# Patient Record
Sex: Male | Born: 1965
Health system: Southern US, Community
[De-identification: ages and names within clinical notes are randomized; demographics above are authoritative.]

## PROBLEM LIST (undated history)

## (undated) DIAGNOSIS — I10 Essential (primary) hypertension: Secondary | ICD-10-CM

## (undated) DIAGNOSIS — K589 Irritable bowel syndrome without diarrhea: Secondary | ICD-10-CM

## (undated) DIAGNOSIS — K219 Gastro-esophageal reflux disease without esophagitis: Secondary | ICD-10-CM

## (undated) DIAGNOSIS — IMO0002 Reserved for concepts with insufficient information to code with codable children: Secondary | ICD-10-CM

## (undated) DIAGNOSIS — I491 Atrial premature depolarization: Secondary | ICD-10-CM

## (undated) DIAGNOSIS — E669 Obesity, unspecified: Secondary | ICD-10-CM

## (undated) DIAGNOSIS — J302 Other seasonal allergic rhinitis: Secondary | ICD-10-CM

## (undated) DIAGNOSIS — E785 Hyperlipidemia, unspecified: Secondary | ICD-10-CM

## (undated) HISTORY — DX: Obesity, unspecified: E66.9

## (undated) HISTORY — DX: Reserved for concepts with insufficient information to code with codable children: IMO0002

## (undated) HISTORY — DX: Gastro-esophageal reflux disease without esophagitis: K21.9

## (undated) HISTORY — DX: Atrial premature depolarization: I49.1

## (undated) HISTORY — DX: Other seasonal allergic rhinitis: J30.2

## (undated) HISTORY — DX: Essential (primary) hypertension: I10

## (undated) HISTORY — DX: Irritable bowel syndrome, unspecified: K58.9

## (undated) HISTORY — DX: Hyperlipidemia, unspecified: E78.5

## (undated) HISTORY — PX: OTHER SURGICAL HISTORY: SHX169

---

## 2008-03-10 ENCOUNTER — Ambulatory Visit: Payer: Self-pay | Admitting: Family Medicine

## 2008-03-10 DIAGNOSIS — Z9189 Other specified personal risk factors, not elsewhere classified: Secondary | ICD-10-CM

## 2008-03-10 DIAGNOSIS — I10 Essential (primary) hypertension: Secondary | ICD-10-CM | POA: Insufficient documentation

## 2008-04-21 ENCOUNTER — Ambulatory Visit: Payer: Self-pay | Admitting: Family Medicine

## 2008-04-26 LAB — CONVERTED CEMR LAB
ALT: 37 units/L (ref 0–53)
AST: 21 units/L (ref 0–37)
Albumin: 3.9 g/dL (ref 3.5–5.2)
Alkaline Phosphatase: 57 units/L (ref 39–117)
BUN: 14 mg/dL (ref 6–23)
Basophils Relative: 0.6 % (ref 0.0–1.0)
Chloride: 104 meq/L (ref 96–112)
Creatinine, Ser: 1 mg/dL (ref 0.4–1.5)
Eosinophils Relative: 1.6 % (ref 0.0–5.0)
Glucose, Bld: 113 mg/dL — ABNORMAL HIGH (ref 70–99)
HCT: 44.6 % (ref 39.0–52.0)
HDL: 33.3 mg/dL — ABNORMAL LOW (ref >39.0)
Monocytes Relative: 9.4 % (ref 3.0–12.0)
Neutrophils Relative %: 62.5 % (ref 43.0–77.0)
Platelets: 308 10*3/uL (ref 150–400)
Potassium: 4 meq/L (ref 3.5–5.1)
RBC: 5.07 M/uL (ref 4.22–5.81)
Total CHOL/HDL Ratio: 4.3
Total Protein: 7.6 g/dL (ref 6.0–8.3)
VLDL: 14 mg/dL (ref 0–40)
WBC: 7 10*3/uL (ref 4.5–10.5)

## 2008-04-28 ENCOUNTER — Ambulatory Visit: Payer: Self-pay | Admitting: Family Medicine

## 2008-04-28 DIAGNOSIS — H6120 Impacted cerumen, unspecified ear: Secondary | ICD-10-CM

## 2008-10-21 ENCOUNTER — Ambulatory Visit: Payer: Self-pay | Admitting: Family Medicine

## 2008-10-21 DIAGNOSIS — H547 Unspecified visual loss: Secondary | ICD-10-CM | POA: Insufficient documentation

## 2008-10-21 DIAGNOSIS — R7309 Other abnormal glucose: Secondary | ICD-10-CM | POA: Insufficient documentation

## 2008-10-26 LAB — CONVERTED CEMR LAB
CO2: 31 meq/L (ref 19–32)
Calcium: 9.3 mg/dL (ref 8.4–10.5)
Chloride: 104 meq/L (ref 96–112)
Hgb A1c MFr Bld: 6 % (ref 4.6–6.0)
Potassium: 4.1 meq/L (ref 3.5–5.1)
Sodium: 141 meq/L (ref 135–145)
Total CHOL/HDL Ratio: 4.3

## 2009-07-18 ENCOUNTER — Ambulatory Visit: Payer: Self-pay | Admitting: Family Medicine

## 2009-07-18 DIAGNOSIS — H60399 Other infective otitis externa, unspecified ear: Secondary | ICD-10-CM

## 2010-01-11 ENCOUNTER — Telehealth: Payer: Self-pay | Admitting: Family Medicine

## 2010-01-31 DIAGNOSIS — E785 Hyperlipidemia, unspecified: Secondary | ICD-10-CM | POA: Insufficient documentation

## 2011-01-08 NOTE — Progress Notes (Signed)
Summary: REQ FOR MED  Phone Note Call from Patient   Caller: Patient [phone] Reason for Call: Talk to Nurse, Talk to Doctor Summary of Call: Pt called to adv that his son has been DX by his pediatrician with the Flu..... Both this pt and his wife Enos Muhl, [place]: 08.26.1971) were adv to obtain a RX for med (Tamiflu 75 mg Capsule) from their PCP..... Pt adv that they were given a sample which they took last night but need a RX to continue with.... Pt adv that same can be sent to [facility] at [place] / [employer]  location.  Initial call taken by: Debbra Riding,  January 11, 2010 11:09 AM  Follow-up for Phone Call        call in Tamiflu 75 mg two times a day for 7 days Follow-up by: Nelwyn Salisbury MD,  January 11, 2010 12:08 PM  Additional Follow-up for Phone Call Additional follow up Details #1::        Phone Call Completed, Rx Called In Additional Follow-up by: Alfred Levins, CMA,  January 11, 2010 12:19 PM    New/Updated Medications: TAMIFLU 75 MG CAPS (OSELTAMIVIR PHOSPHATE) Take one capsule by mouth twice a day Prescriptions: TAMIFLU 75 MG CAPS (OSELTAMIVIR PHOSPHATE) Take one capsule by mouth twice a day  #14 x 0   Entered by:   Alfred Levins, CMA   Authorized by:   Nelwyn Salisbury MD   Signed by:   Alfred Levins, CMA on 01/11/2010   Method used:   Electronically to        [facility] # [id number]* (retail)       [address]       [place], [place]  [address]       Ph: [phone]       Fax: [phone]   RxID:   [id number]

## 2011-01-18 ENCOUNTER — Encounter: Payer: Self-pay | Admitting: Family Medicine

## 2011-01-18 ENCOUNTER — Ambulatory Visit (INDEPENDENT_AMBULATORY_CARE_PROVIDER_SITE_OTHER): Payer: BC Managed Care – PPO | Admitting: Family Medicine

## 2011-01-18 VITALS — BP 120/86 | HR 79 | Temp 98.2°F | Wt 227.0 lb

## 2011-01-18 DIAGNOSIS — H6120 Impacted cerumen, unspecified ear: Secondary | ICD-10-CM

## 2011-01-18 DIAGNOSIS — H9190 Unspecified hearing loss, unspecified ear: Secondary | ICD-10-CM

## 2011-01-18 NOTE — Progress Notes (Signed)
  Subjective:    Patient ID: Michael Parker, male    DOB: 11/20/1966, 45 y.o.   MRN: [id number]  HPI Here for one week of pressure in both ears and decreased hearing. No pain. No URI symptoms.    Review of Systems  Constitutional: Negative.   HENT: Positive for hearing loss and congestion. Negative for ear pain, nosebleeds, facial swelling, rhinorrhea, sneezing, neck pain, neck stiffness, dental problem, postnasal drip, sinus pressure, tinnitus and ear discharge.   Eyes: Negative.   Respiratory: Negative.        Objective:   Physical Exam  Constitutional: He appears well-developed and well-nourished. No distress.  HENT:  Head: Normocephalic and atraumatic.  Nose: Nose normal.  Mouth/Throat: Oropharynx is clear and moist. No oropharyngeal exudate.       Both ears are occluded  with cerumen   Eyes: Conjunctivae and EOM are normal. Pupils are equal, round, and reactive to light.  Neck: Normal range of motion. Neck supple.  Lymphadenopathy:    He has no cervical adenopathy.          Assessment & Plan:  Both ears were irrigated clear with water. After his ears were irrigated, he felt fine and his hearing was back to normal.

## 2011-03-29 ENCOUNTER — Other Ambulatory Visit (INDEPENDENT_AMBULATORY_CARE_PROVIDER_SITE_OTHER): Payer: BC Managed Care – PPO | Admitting: Family Medicine

## 2011-03-29 DIAGNOSIS — Z0000 Encounter for general adult medical examination without abnormal findings: Secondary | ICD-10-CM

## 2011-03-29 LAB — POCT URINALYSIS DIPSTICK
Blood, UA: NEGATIVE
Nitrite, UA: NEGATIVE
Protein, UA: NEGATIVE
Spec Grav, UA: 1.025
Urobilinogen, UA: 0.2

## 2011-03-29 LAB — LIPID PANEL
LDL Cholesterol: 86 mg/dL (ref 0–99)
Total CHOL/HDL Ratio: 3
Triglycerides: 41 mg/dL (ref 0.0–149.0)

## 2011-03-29 LAB — CBC WITH DIFFERENTIAL/PLATELET
Basophils Relative: 0.6 % (ref 0.0–3.0)
Eosinophils Absolute: 0.1 10*3/uL (ref 0.0–0.7)
Hemoglobin: 15.3 g/dL (ref 13.0–17.0)
Lymphocytes Relative: 22.8 % (ref 12.0–46.0)
MCHC: 34 g/dL (ref 30.0–36.0)
Neutro Abs: 5.1 10*3/uL (ref 1.4–7.7)
RBC: 5.03 Mil/uL (ref 4.22–5.81)

## 2011-03-29 LAB — BASIC METABOLIC PANEL
CO2: 28 mEq/L (ref 19–32)
Calcium: 9 mg/dL (ref 8.4–10.5)
Chloride: 100 mEq/L (ref 96–112)
Potassium: 4.5 mEq/L (ref 3.5–5.1)
Sodium: 136 mEq/L (ref 135–145)

## 2011-03-29 LAB — HEPATIC FUNCTION PANEL
AST: 20 U/L (ref 0–37)
Albumin: 3.9 g/dL (ref 3.5–5.2)
Alkaline Phosphatase: 71 U/L (ref 39–117)
Total Protein: 7.2 g/dL (ref 6.0–8.3)

## 2011-03-29 LAB — TSH: TSH: 2.02 u[IU]/mL (ref 0.35–5.50)

## 2011-04-01 ENCOUNTER — Telehealth: Payer: Self-pay | Admitting: Family Medicine

## 2011-04-01 NOTE — Telephone Encounter (Signed)
Message copied by Burnard Leigh on Mon Apr 01, 2011 10:50 AM ------      Message from: Dwaine Deter      Created: Mon Apr 01, 2011 10:08 AM       normal

## 2011-04-05 ENCOUNTER — Ambulatory Visit (INDEPENDENT_AMBULATORY_CARE_PROVIDER_SITE_OTHER): Payer: BC Managed Care – PPO | Admitting: Family Medicine

## 2011-04-05 ENCOUNTER — Encounter: Payer: Self-pay | Admitting: Family Medicine

## 2011-04-05 VITALS — BP 148/96 | HR 88 | Temp 98.3°F | Ht 72.5 in | Wt 223.0 lb

## 2011-04-05 DIAGNOSIS — Z0000 Encounter for general adult medical examination without abnormal findings: Secondary | ICD-10-CM

## 2011-04-05 MED ORDER — AMLODIPINE BESYLATE 5 MG PO TABS: 5.0000 mg | ORAL_TABLET | Freq: Every day | ORAL | Status: DC

## 2011-04-05 NOTE — Progress Notes (Signed)
  Subjective:    Patient ID: Michael Parker, male    DOB: 1966/11/06, 45 y.o.   MRN: [id number]  HPI 45 yr old male for a cpx. He feels fine with no complaints. He had taken BP meds a few years ago but was able to stop them with diet and exercise. Now he has gained about 16 lbs in the past year, and the BP is back up again.    Review of Systems  Constitutional: Negative.   HENT: Negative.   Eyes: Negative.   Respiratory: Negative.   Cardiovascular: Negative.   Gastrointestinal: Negative.   Genitourinary: Negative.   Musculoskeletal: Negative.   Skin: Negative.   Neurological: Negative.   Hematological: Negative.   Psychiatric/Behavioral: Negative.        Objective:   Physical Exam  Constitutional: He is oriented to person, place, and time. He appears well-developed and well-nourished. No distress.  HENT:  Head: Normocephalic and atraumatic.  Right Ear: External ear normal.  Left Ear: External ear normal.  Nose: Nose normal.  Mouth/Throat: Oropharynx is clear and moist. No oropharyngeal exudate.  Eyes: Conjunctivae and EOM are normal. Pupils are equal, round, and reactive to light. Right eye exhibits no discharge. Left eye exhibits no discharge. No scleral icterus.  Neck: Neck supple. No JVD present. No tracheal deviation present. No thyromegaly present.  Cardiovascular: Normal rate, regular rhythm, normal heart sounds and intact distal pulses.  Exam reveals no gallop and no friction rub.   No murmur heard. Pulmonary/Chest: Effort normal and breath sounds normal. No respiratory distress. He has no wheezes. He has no rales. He exhibits no tenderness.  Abdominal: Soft. Bowel sounds are normal. He exhibits no distension and no mass. There is no tenderness. There is no rebound and no guarding.  Genitourinary: Penis normal. No penile tenderness.  Musculoskeletal: Normal range of motion. He exhibits no edema and no tenderness.  Lymphadenopathy:    He has no cervical adenopathy.    Neurological: He is alert and oriented to person, place, and time. He has normal reflexes. No cranial nerve deficit. He exhibits normal muscle tone. Coordination normal.  Skin: Skin is warm and dry. No rash noted. He is not diaphoretic. No erythema. No pallor.  Psychiatric: He has a normal mood and affect. His behavior is normal. Judgment and thought content normal.          Assessment & Plan:  He needs to lose weight. Get back on HTN meds.

## 2012-01-30 ENCOUNTER — Encounter: Payer: Self-pay | Admitting: Family Medicine

## 2012-01-30 ENCOUNTER — Ambulatory Visit (INDEPENDENT_AMBULATORY_CARE_PROVIDER_SITE_OTHER): Payer: BC Managed Care – PPO | Admitting: Family Medicine

## 2012-01-30 VITALS — BP 126/88 | HR 80 | Temp 98.3°F | Wt 228.0 lb

## 2012-01-30 DIAGNOSIS — H6120 Impacted cerumen, unspecified ear: Secondary | ICD-10-CM

## 2012-01-30 NOTE — Progress Notes (Signed)
  Subjective:    Patient ID: Michael Parker, male    DOB: 1966/08/23, 46 y.o.   MRN: [id number]  HPI Here fopr 3 days of pressure and decreased hearing in the right ear. No pain. He tries to flush his ears out with peroxide at home periodically. No sinus or URI symptoms.    Review of Systems  Constitutional: Negative.   HENT: Positive for hearing loss. Negative for ear pain, congestion, postnasal drip and ear discharge.   Eyes: Negative.   Respiratory: Negative.        Objective:   Physical Exam  Constitutional: He appears well-developed and well-nourished.  HENT:  Nose: Nose normal.  Mouth/Throat: Oropharynx is clear and moist. No oropharyngeal exudate.       Right ear canal is full of cerumen  Eyes: Conjunctivae are normal.  Lymphadenopathy:    He has no cervical adenopathy.          Assessment & Plan:  The ear canal was irrigated clear with water

## 2012-03-24 ENCOUNTER — Telehealth: Payer: Self-pay | Admitting: Family Medicine

## 2012-03-24 NOTE — Telephone Encounter (Signed)
Pt needs to know date of last tetanus shot. Pt filling out a form for his work.

## 2012-03-25 NOTE — Telephone Encounter (Signed)
Spoke with pt, looks like he has not had one since coming to [place].

## 2012-03-30 ENCOUNTER — Other Ambulatory Visit (INDEPENDENT_AMBULATORY_CARE_PROVIDER_SITE_OTHER): Payer: BC Managed Care – PPO

## 2012-03-30 DIAGNOSIS — Z0000 Encounter for general adult medical examination without abnormal findings: Secondary | ICD-10-CM

## 2012-03-30 LAB — POCT URINALYSIS DIPSTICK
Bilirubin, UA: NEGATIVE
Blood, UA: NEGATIVE
Glucose, UA: NEGATIVE
Nitrite, UA: NEGATIVE
Spec Grav, UA: 1.02

## 2012-03-30 LAB — CBC WITH DIFFERENTIAL/PLATELET
Basophils Absolute: 0 10*3/uL (ref 0.0–0.1)
Lymphocytes Relative: 14.7 % (ref 12.0–46.0)
Monocytes Relative: 6.4 % (ref 3.0–12.0)
Platelets: 279 10*3/uL (ref 150.0–400.0)
RDW: 13.5 % (ref 11.5–14.6)

## 2012-03-30 LAB — LIPID PANEL
HDL: 51.3 mg/dL (ref >39.00)
Total CHOL/HDL Ratio: 3
Triglycerides: 44 mg/dL (ref 0.0–149.0)

## 2012-03-30 LAB — BASIC METABOLIC PANEL
BUN: 19 mg/dL (ref 6–23)
Calcium: 9 mg/dL (ref 8.4–10.5)
GFR: 95.28 mL/min (ref >60.00)
Glucose, Bld: 90 mg/dL (ref 70–99)
Sodium: 139 mEq/L (ref 135–145)

## 2012-03-30 LAB — HEPATIC FUNCTION PANEL
AST: 19 U/L (ref 0–37)
Alkaline Phosphatase: 71 U/L (ref 39–117)
Bilirubin, Direct: 0.1 mg/dL (ref 0.0–0.3)
Total Bilirubin: 0.7 mg/dL (ref 0.3–1.2)

## 2012-03-31 LAB — TSH: TSH: 1.53 u[IU]/mL (ref 0.35–5.50)

## 2012-04-06 ENCOUNTER — Encounter: Payer: BC Managed Care – PPO | Admitting: Family Medicine

## 2012-04-06 NOTE — Progress Notes (Signed)
Quick Note:  Left voice message ______ 

## 2012-04-08 ENCOUNTER — Encounter: Payer: Self-pay | Admitting: Family Medicine

## 2012-04-08 ENCOUNTER — Ambulatory Visit (INDEPENDENT_AMBULATORY_CARE_PROVIDER_SITE_OTHER): Payer: BC Managed Care – PPO | Admitting: Family Medicine

## 2012-04-08 VITALS — BP 128/76 | HR 73 | Temp 98.4°F | Ht 72.5 in | Wt 222.0 lb

## 2012-04-08 DIAGNOSIS — Z0000 Encounter for general adult medical examination without abnormal findings: Secondary | ICD-10-CM

## 2012-04-08 MED ORDER — AMLODIPINE BESYLATE 5 MG PO TABS: 5.0000 mg | ORAL_TABLET | Freq: Every day | ORAL | Status: DC

## 2012-04-08 NOTE — Progress Notes (Signed)
  Subjective:    Patient ID: Michael Parker, male    DOB: 11-May-1966, 46 y.o.   MRN: [id number]  HPI 46 yr old male for a cpx. He feels well and has no concerns.   Review of Systems  Constitutional: Negative.   HENT: Negative.   Eyes: Negative.   Respiratory: Negative.   Cardiovascular: Negative.   Gastrointestinal: Negative.   Genitourinary: Negative.   Musculoskeletal: Negative.   Skin: Negative.   Neurological: Negative.   Hematological: Negative.   Psychiatric/Behavioral: Negative.        Objective:   Physical Exam  Constitutional: He is oriented to person, place, and time. He appears well-developed and well-nourished. No distress.  HENT:  Head: Normocephalic and atraumatic.  Right Ear: External ear normal.  Left Ear: External ear normal.  Nose: Nose normal.  Mouth/Throat: Oropharynx is clear and moist. No oropharyngeal exudate.  Eyes: Conjunctivae and EOM are normal. Pupils are equal, round, and reactive to light. Right eye exhibits no discharge. Left eye exhibits no discharge. No scleral icterus.  Neck: Neck supple. No JVD present. No tracheal deviation present. No thyromegaly present.  Cardiovascular: Normal rate, regular rhythm, normal heart sounds and intact distal pulses.  Exam reveals no gallop and no friction rub.   No murmur heard. Pulmonary/Chest: Effort normal and breath sounds normal. No respiratory distress. He has no wheezes. He has no rales. He exhibits no tenderness.  Abdominal: Soft. Bowel sounds are normal. He exhibits no distension and no mass. There is no tenderness. There is no rebound and no guarding.  Genitourinary: Rectum normal, prostate normal and penis normal. Guaiac negative stool. No penile tenderness.  Musculoskeletal: Normal range of motion. He exhibits no edema and no tenderness.  Lymphadenopathy:    He has no cervical adenopathy.  Neurological: He is alert and oriented to person, place, and time. He has normal reflexes. No cranial nerve  deficit. He exhibits normal muscle tone. Coordination normal.  Skin: Skin is warm and dry. No rash noted. He is not diaphoretic. No erythema. No pallor.  Psychiatric: He has a normal mood and affect. His behavior is normal. Judgment and thought content normal.          Assessment & Plan:  Well exam.

## 2012-12-09 HISTORY — PX: UPPER GASTROINTESTINAL ENDOSCOPY: SHX188

## 2012-12-24 ENCOUNTER — Emergency Department (HOSPITAL_COMMUNITY): Payer: BC Managed Care – PPO

## 2012-12-24 ENCOUNTER — Emergency Department (HOSPITAL_COMMUNITY)
Admission: EM | Admit: 2012-12-24 | Discharge: 2012-12-24 | Disposition: A | Discharge status: Home / Self Care | Payer: BC Managed Care – PPO | Attending: Emergency Medicine | Admitting: Emergency Medicine

## 2012-12-24 ENCOUNTER — Encounter (HOSPITAL_COMMUNITY): Payer: Self-pay | Admitting: *Deleted

## 2012-12-24 DIAGNOSIS — Z8639 Personal history of other endocrine, nutritional and metabolic disease: Secondary | ICD-10-CM | POA: Insufficient documentation

## 2012-12-24 DIAGNOSIS — E119 Type 2 diabetes mellitus without complications: Secondary | ICD-10-CM | POA: Insufficient documentation

## 2012-12-24 DIAGNOSIS — I10 Essential (primary) hypertension: Secondary | ICD-10-CM | POA: Insufficient documentation

## 2012-12-24 DIAGNOSIS — I2510 Atherosclerotic heart disease of native coronary artery without angina pectoris: Secondary | ICD-10-CM | POA: Insufficient documentation

## 2012-12-24 DIAGNOSIS — R197 Diarrhea, unspecified: Secondary | ICD-10-CM

## 2012-12-24 DIAGNOSIS — E669 Obesity, unspecified: Secondary | ICD-10-CM | POA: Insufficient documentation

## 2012-12-24 DIAGNOSIS — I880 Nonspecific mesenteric lymphadenitis: Secondary | ICD-10-CM

## 2012-12-24 DIAGNOSIS — M793 Panniculitis, unspecified: Secondary | ICD-10-CM

## 2012-12-24 DIAGNOSIS — Z8619 Personal history of other infectious and parasitic diseases: Secondary | ICD-10-CM | POA: Insufficient documentation

## 2012-12-24 DIAGNOSIS — Z862 Personal history of diseases of the blood and blood-forming organs and certain disorders involving the immune mechanism: Secondary | ICD-10-CM | POA: Insufficient documentation

## 2012-12-24 DIAGNOSIS — R109 Unspecified abdominal pain: Secondary | ICD-10-CM | POA: Insufficient documentation

## 2012-12-24 LAB — URINALYSIS, ROUTINE W REFLEX MICROSCOPIC
Bilirubin Urine: NEGATIVE
Glucose, UA: NEGATIVE mg/dL
Hgb urine dipstick: NEGATIVE
Ketones, ur: NEGATIVE mg/dL
Protein, ur: NEGATIVE mg/dL

## 2012-12-24 LAB — COMPREHENSIVE METABOLIC PANEL
AST: 28 U/L (ref 0–37)
Albumin: 3.7 g/dL (ref 3.5–5.2)
Alkaline Phosphatase: 65 U/L (ref 39–117)
BUN: 22 mg/dL (ref 6–23)
Chloride: 105 mEq/L (ref 96–112)
Potassium: 4.3 mEq/L (ref 3.5–5.1)
Total Bilirubin: 0.3 mg/dL (ref 0.3–1.2)

## 2012-12-24 LAB — CBC WITH DIFFERENTIAL/PLATELET
Basophils Absolute: 0 10*3/uL (ref 0.0–0.1)
Basophils Relative: 0 % (ref 0–1)
Hemoglobin: 14.6 g/dL (ref 13.0–17.0)
MCHC: 34.2 g/dL (ref 30.0–36.0)
Monocytes Relative: 6 % (ref 3–12)
Neutro Abs: 9.3 10*3/uL — ABNORMAL HIGH (ref 1.7–7.7)
Neutrophils Relative %: 85 % — ABNORMAL HIGH (ref 43–77)
RBC: 4.94 MIL/uL (ref 4.22–5.81)
WBC: 11 10*3/uL — ABNORMAL HIGH (ref 4.0–10.5)

## 2012-12-24 MED ORDER — DICYCLOMINE HCL 20 MG PO TABS: 20.0000 mg | ORAL_TABLET | Freq: Two times a day (BID) | ORAL | Status: DC

## 2012-12-24 MED ORDER — CIPROFLOXACIN HCL 500 MG PO TABS: ORAL_TABLET | ORAL | Status: DC

## 2012-12-24 MED ORDER — METRONIDAZOLE 500 MG PO TABS: 500.0000 mg | ORAL_TABLET | Freq: Two times a day (BID) | ORAL | Status: DC

## 2012-12-24 MED ORDER — CIPROFLOXACIN IN D5W 400 MG/200ML IV SOLN: 400.0000 mg | Freq: Once | INTRAVENOUS | Status: DC

## 2012-12-24 MED ORDER — SODIUM CHLORIDE 0.9 % IV BOLUS (SEPSIS): 1000.0000 mL | Freq: Once | INTRAVENOUS | Status: DC

## 2012-12-24 MED ORDER — METRONIDAZOLE IN NACL 5-0.79 MG/ML-% IV SOLN: 500.0000 mg | Freq: Once | INTRAVENOUS | Status: DC

## 2012-12-24 MED ORDER — SODIUM CHLORIDE 0.9 % IV BOLUS (SEPSIS)
1000.0000 mL | Freq: Once | INTRAVENOUS | Status: AC
  Administered 2012-12-24: 1000 mL via INTRAVENOUS

## 2012-12-24 MED ORDER — IOHEXOL 300 MG/ML  SOLN
20.0000 mL | INTRAMUSCULAR | Status: AC
  Administered 2012-12-24: 07:00:00 via ORAL

## 2012-12-24 MED ORDER — IOHEXOL 300 MG/ML  SOLN
80.0000 mL | Freq: Once | INTRAMUSCULAR | Status: AC | PRN
  Administered 2012-12-24: 80 mL via INTRAVENOUS

## 2012-12-24 NOTE — ED Notes (Signed)
Patient ambulated to restroom and tolerated well.  

## 2012-12-24 NOTE — ED Provider Notes (Signed)
Assumed care of the patient from PA paz.  Michael Parker is a 47 y.o. male w a hx of DM & CAD who presented with c/o diarrhea. He reported intermittent diarrhea over the last week with resolution. He ate a large meal (ham sandwitch, corn dogs and chocolate milk)And then  had an episode of "explosive" diarrhea and severe abdominal cramping/pain. The pain has been gradually improving. Pain is periumbilical and may represent tenesmus. Patient awainting labs  And CT abdomen to r/o IBD.  Physical Exam  Nursing note and vitals reviewed. Constitutional: He appears well-developed and well-nourished. No distress.  HENT:  Head: Normocephalic and atraumatic.  Eyes: Conjunctivae normal are normal. No scleral icterus.  Neck: Normal range of motion. Neck supple.  Cardiovascular: Normal rate, regular rhythm and normal heart sounds.   Pulmonary/Chest: Effort normal and breath sounds normal. No respiratory distress.  Abdominal: Soft. There is no tenderness.  Musculoskeletal: He exhibits no edema.  Neurological: He is alert.  Skin: Skin is warm and dry. He is not diaphoretic.  Psychiatric: His behavior is normal.     9:06 AM CT Abdomen Pelvis W Contrast (Final result)   Result time:12/24/12 0820    Final result by Rad Results In Interface (12/24/12 08:20:18)    Narrative:   *RADIOLOGY REPORT*  Clinical Data: Epigastric periumbilical abdominal pain. Nausea. Loose stools. History of coronary artery disease, obesity, diabetes.  CT ABDOMEN AND PELVIS WITH CONTRAST  Technique: Multidetector CT imaging of the abdomen and pelvis was performed following the standard protocol during bolus administration of intravenous contrast.  Contrast: 1 OMNIPAQUE IOHEXOL 300 MG/ML SOLN, 80mL OMNIPAQUE IOHEXOL 300 MG/ML SOLN  Comparison: None  Findings: Lung bases: Clear lung bases. Normal heart size without pericardial or pleural effusion. The lower thoracic esophagus is dilated and contrast  filled.  Abdomen/pelvis: Normal liver, spleen, distal stomach, pancreas, gallbladder, biliary tract, adrenal glands. Right renal sub centimeter cyst. An upper pole left renal cyst or minimally complex cyst. Bilateral too small to characterize renal lesions.  No retroperitoneal or retrocrural adenopathy.  Normal colon and terminal ileum. Normal appendix, including on image 48/series 2. Normal small bowel without abdominal ascites.  There are prominent nodes within the jejunal mesentery with increased mesenteric fat density. Example image 34/series 2.  No pelvic adenopathy. Normal urinary bladder and prostate. No significant free fluid.  Bones/Musculoskeletal: Degenerative partial fusion of the left sacroiliac joint. Advanced degenerative disc disease at the lumbosacral junction.  IMPRESSION:  1. Findings suspicious for mesenteric adenitis/panniculitis. 2. Normal appendix, without other explanation for abdominal pain. 3. Esophageal air fluid level suggests dysmotility or gastroesophageal reflux.   Original Report Authenticated By: Jeronimo Greaves, M.D.        Patient with adenitis/panniculutis on CT.  Concern for yersinia infection.  Will begin d/c with abx cipro/flagyl and bentyl.  Patient pain resolved.  Paitnet seen in shared visit with Dr. Ranae Palms.  At this time there does not appear to be any evidence of an acute emergency medical condition and the patient appears stable for discharge with appropriate outpatient follow up.Diagnosis was discussed with patient who verbalizes understanding and is agreeable to discharge. Pt case discussed with Dr. Ranae Palms who agrees with my plan.    Arthor Captain, PA-C 12/25/12 [phone]

## 2012-12-24 NOTE — ED Provider Notes (Signed)
Medical screening examination/treatment/procedure(s) were performed by non-physician practitioner and as supervising physician I was immediately available for consultation/collaboration.  Jasmine Awe, MD 12/24/12 [phone]

## 2012-12-24 NOTE — ED Notes (Signed)
Pt states that he started having diarrhea on Sunday, that has progressively worsened and now pt has severe cramping.

## 2012-12-24 NOTE — ED Provider Notes (Signed)
History     CSN: [id number]  Arrival date & time 12/24/12  0343   First MD Initiated Contact with Patient 12/24/12 [phone]      Chief Complaint  Patient presents with  . Abdominal Pain    (Consider location/radiation/quality/duration/timing/severity/associated sxs/prior treatment) HPI Comments: Michael Parker is a 47 y.o. male w a hx of DM & CAD presents to the ER c/o diarrhea. He reports that he has been suffering from intermittent diarrhea over the last week and thought it had resolved at which point he ate a very large meal (ham sandwitch, corn dogs and chocolate milk). After pt had an episode of "explosive" diarrhea and severe abdominal cramping/pain. The pain has been gradually improving. Pain was a 12/q0 and now rated at 2/10. Symptoms are moderate. No known exacerbating or alleviating factors. Pain located in the peri umbilical region and is without radiation. Associated symptoms include nausea. Pt denies any melena, hematochezia, vomiting, fevers, night sweats, chills, chest pain, SOB, syncope, dizziness, or cold/hot intolerance. Pt is a non smoker and does not use chronic NSAIDs.   Patient is a 47 y.o. male presenting with abdominal pain.  Abdominal Pain The primary symptoms of the illness include abdominal pain.    Past Medical History  Diagnosis Date  . Hypertension   . Chickenpox   . CAD (coronary artery disease)     mild calciium score of 49  . Obesity   . Diabetes mellitus   . Hyperlipemia     No past surgical history on file.  Family History  Problem Relation Age of Onset  . Depression      family hx  . Hyperlipidemia      family hx  . Hypertension      family hx  . Sudden death      family hx     History  Substance Use Topics  . Smoking status: Never Smoker   . Smokeless tobacco: Never Used  . Alcohol Use: No      Review of Systems  Gastrointestinal: Positive for abdominal pain.  All other systems reviewed and are negative.    Allergies    Codeine  Home Medications   Current Outpatient Rx  Name  Route  Sig  Dispense  Refill  . AMLODIPINE BESYLATE 5 MG PO TABS   Oral   Take 1 tablet (5 mg total) by mouth daily.   90 tablet   3     BP 157/90  Pulse 92  Temp 97.5 F (36.4 C)  Resp 18  SpO2 99%  Physical Exam  Nursing note and vitals reviewed. Constitutional: Vital signs are normal. He appears well-developed and well-nourished. No distress.  HENT:  Head: Normocephalic and atraumatic.  Mouth/Throat: Uvula is midline, oropharynx is clear and moist and mucous membranes are normal.  Eyes: Conjunctivae normal and EOM are normal. Pupils are equal, round, and reactive to light.  Neck: Normal range of motion and full passive range of motion without pain. Neck supple. No spinous process tenderness and no muscular tenderness present. No rigidity. No Brudzinski's sign noted.  Cardiovascular: Regular rhythm.        Mild tachycardia  Pulmonary/Chest: Effort normal and breath sounds normal. No accessory muscle usage. Not tachypneic. No respiratory distress.       LCAB   Abdominal: Soft. Normal appearance. He exhibits no distension, no ascites, no pulsatile midline mass and no mass. There is tenderness. There is no CVA tenderness. No hernia.       Hyperactive  bowel sounds. Obese abdomen without peritoneal signs. No pulsatile aorta. Mild periumbilical ttp.   Lymphadenopathy:    He has no cervical adenopathy.  Neurological: He is alert.  Skin: Skin is warm and dry. No rash noted. He is not diaphoretic.       No rash, skin warm and non diaphoretic  Psychiatric: He has a normal mood and affect. His speech is normal and behavior is normal.    ED Course  Procedures (including critical care time)   Labs Reviewed  CBC WITH DIFFERENTIAL  COMPREHENSIVE METABOLIC PANEL  URINALYSIS, ROUTINE W REFLEX MICROSCOPIC   No results found.   No diagnosis found.    MDM  Pt w hx of DM  to ER c/o intermittent diarrhea and sever  abdominal cramping x 1 week. Pt has been seen and examined; Initial history and physical IV fluids, pain meds & antiemetics given. Labs and urine tests have been ordered and pending. Disposition will be pending lab studies and reassessment. Will be signed out to the oncoming provider, [employer], [place] 12/24/12 [phone]

## 2013-01-02 NOTE — ED Provider Notes (Signed)
Medical screening examination/treatment/procedure(s) were performed by non-physician practitioner and as supervising physician I was immediately available for consultation/collaboration.   Loren Racer, MD 01/02/13 1530

## 2013-01-06 ENCOUNTER — Encounter: Payer: Self-pay | Admitting: Family Medicine

## 2013-01-06 ENCOUNTER — Ambulatory Visit (INDEPENDENT_AMBULATORY_CARE_PROVIDER_SITE_OTHER): Payer: BC Managed Care – PPO | Admitting: Family Medicine

## 2013-01-06 VITALS — BP 150/90 | HR 98 | Temp 98.6°F | Wt 215.0 lb

## 2013-01-06 DIAGNOSIS — K589 Irritable bowel syndrome without diarrhea: Secondary | ICD-10-CM

## 2013-01-06 DIAGNOSIS — I880 Nonspecific mesenteric lymphadenitis: Secondary | ICD-10-CM

## 2013-01-06 MED ORDER — NYSTATIN 100000 UNIT/ML MT SUSP: 500000.0000 [IU] | Freq: Four times a day (QID) | OROMUCOSAL | Status: DC

## 2013-01-06 NOTE — Progress Notes (Signed)
  Subjective:    Patient ID: Michael Parker, male    DOB: 10/08/1966, 47 y.o.   MRN: [id number]  HPI Here to follow up an ER visit on 12-24-12 for abdominal pain and diarrhea. He had had some diarrhea for a week and then on the evening of the ER visit he ate a large meal, then promptly had explosive diarrhea and severe cramps. He has never had any nausea or vomiting, no fever. His labs were normal except for a WBC of 11.0 with a slight left shift. The CT scan was normal except for some enlarged mesenteric nodes. He was felt to have mesenteric adenitis and was given courses of Flagyl and Cipro. He now feels much better with onoy mild cramps and occasional light diarrhea. He has a long hx of IBS since he was a teenager, and he often has diarrhea. He thinks he is back to his baseline now. He mentions having a white color on the tongue but it is not sore.    Review of Systems  Constitutional: Negative.   HENT: Negative.   Gastrointestinal: Positive for abdominal pain and diarrhea. Negative for nausea, vomiting, constipation, blood in stool and abdominal distention.       Objective:   Physical Exam  Constitutional: He appears well-developed and well-nourished.  HENT:       The tongue is white but not tender  Abdominal: Soft. Bowel sounds are normal. He exhibits no distension and no mass. There is no tenderness. There is no rebound and no guarding.          Assessment & Plan:  The mesenteric adenitis has resolved. Treat the thrush with Nystatin. He has changed his diet to avoid spicy foods to help with the IBS.

## 2013-01-15 ENCOUNTER — Encounter: Payer: Self-pay | Admitting: Family Medicine

## 2013-01-15 ENCOUNTER — Ambulatory Visit (INDEPENDENT_AMBULATORY_CARE_PROVIDER_SITE_OTHER): Payer: BC Managed Care – PPO | Admitting: Family Medicine

## 2013-01-15 VITALS — BP 146/82 | HR 96 | Temp 98.3°F | Wt 214.0 lb

## 2013-01-15 DIAGNOSIS — R109 Unspecified abdominal pain: Secondary | ICD-10-CM

## 2013-01-15 DIAGNOSIS — Z23 Encounter for immunization: Secondary | ICD-10-CM

## 2013-01-15 NOTE — Progress Notes (Signed)
  Subjective:    Patient ID: Michael Parker, male    DOB: [birth year], 47 y.o.   MRN: [id number]  HPI Here for a TDaP and also with questions about his abdomen. He was here one week ago to follow up after a bout of mesenteric adenitis, and we advised him to eat a very bland diet and to advance this very slowly. He did very well on this with abdominal pains at all. Then 2 days ago he decided he could, barbequed chicken, pizza, and chocolate chip cookies. After eating all this he had a bout of right flank pains that lasted about 6 hours. This resolved and today he feels fine again.    Review of Systems  Constitutional: Negative.   Respiratory: Negative.   Cardiovascular: Negative.   Gastrointestinal: Positive for abdominal pain. Negative for nausea, vomiting, diarrhea, constipation, blood in stool and abdominal distention.       Objective:   Physical Exam  Constitutional: He appears well-developed and well-nourished.  Abdominal: Soft. Bowel sounds are normal. He exhibits no distension and no mass. There is no tenderness. There is no rebound and no guarding.          Assessment & Plan:  I think he simply ate too much too fast, and he ate fatty spicy foods before he should have. Advised him to go back to a bland diet as we discussed.

## 2013-01-29 ENCOUNTER — Encounter: Payer: Self-pay | Admitting: Family Medicine

## 2013-01-29 ENCOUNTER — Ambulatory Visit (INDEPENDENT_AMBULATORY_CARE_PROVIDER_SITE_OTHER): Payer: BC Managed Care – PPO | Admitting: Family Medicine

## 2013-01-29 VITALS — BP 150/90 | HR 97 | Temp 98.7°F | Wt 200.0 lb

## 2013-01-29 DIAGNOSIS — R1013 Epigastric pain: Secondary | ICD-10-CM

## 2013-01-29 LAB — CBC WITH DIFFERENTIAL/PLATELET
Basophils Absolute: 0 10*3/uL (ref 0.0–0.1)
Basophils Relative: 0.6 % (ref 0.0–3.0)
Hemoglobin: 16.1 g/dL (ref 13.0–17.0)
Lymphocytes Relative: 15.6 % (ref 12.0–46.0)
Monocytes Relative: 7.8 % (ref 3.0–12.0)
Neutro Abs: 5 10*3/uL (ref 1.4–7.7)
Neutrophils Relative %: 75.7 % (ref 43.0–77.0)
RBC: 5.48 Mil/uL (ref 4.22–5.81)
RDW: 14.6 % (ref 11.5–14.6)

## 2013-01-29 LAB — BASIC METABOLIC PANEL
CO2: 27 mEq/L (ref 19–32)
Calcium: 9.4 mg/dL (ref 8.4–10.5)
Creatinine, Ser: 1.1 mg/dL (ref 0.4–1.5)
Glucose, Bld: 102 mg/dL — ABNORMAL HIGH (ref 70–99)

## 2013-01-29 LAB — LIPASE: Lipase: 32 U/L (ref 11.0–59.0)

## 2013-01-29 LAB — HEPATIC FUNCTION PANEL
Albumin: 4.4 g/dL (ref 3.5–5.2)
Total Protein: 8 g/dL (ref 6.0–8.3)

## 2013-01-29 MED ORDER — ESOMEPRAZOLE MAGNESIUM 40 MG PO CPDR: 40.0000 mg | DELAYED_RELEASE_CAPSULE | Freq: Every day | ORAL | Status: DC

## 2013-01-29 NOTE — Progress Notes (Signed)
  Subjective:    Patient ID: Michael Parker, male    DOB: [birth year], 47 y.o.   MRN: [id number]  HPI Here with his wife to follow up on abdominal pains. The pains come and go, and they are centered around the epigastrium and the RUQ. He alternated between having formed stools and diarrhea. No nausea or fever. He is eating bland foods, but he is reluctant to eat anything because it makes him feel bad. He has lost 14 lbs since this began one month ago.    Review of Systems  Constitutional: Positive for appetite change and unexpected weight change. Negative for fever and activity change.  Gastrointestinal: Positive for abdominal pain, diarrhea and abdominal distention. Negative for vomiting, constipation, blood in stool and rectal pain.       Objective:   Physical Exam  Constitutional: He appears well-developed and well-nourished. No distress.  Abdominal: Soft. Bowel sounds are normal. He exhibits no distension and no mass. There is no rebound and no guarding.  Mildly tender in the epigastrium and RUQ          Assessment & Plan:  This could represent a duodenal ulcer, so I gave him samples to try Nexium. Get labs today. Refer to GI

## 2013-02-01 ENCOUNTER — Encounter: Payer: Self-pay | Admitting: Gastroenterology

## 2013-02-01 ENCOUNTER — Telehealth: Payer: Self-pay | Admitting: Family Medicine

## 2013-02-01 NOTE — Progress Notes (Signed)
Quick Note:  I spoke with pt ______ 

## 2013-02-01 NOTE — Telephone Encounter (Signed)
I spoke with pt and gave normal lab results. Pt is going to keep the upcoming appointment with GI.

## 2013-02-01 NOTE — Telephone Encounter (Signed)
Patient Information:  Caller Name: [place]  Phone: [phone]  Patient: Michael Parker, Michael Parker  Gender: Male  DOB: [birth year]  Age: 47 Years  PCP: Gershon Crane [facility])  Office Follow Up:  Does the office need to follow up with this patient?: Yes  Instructions For The Office: Please expedite process with gastroenterology if possible and call patient with his lab results from 01/29/13.  Thank you.   Symptoms  Reason For Call & Symptoms: Requests lab results from 01/29/13.  He also states first appointment available with Dr. Christella Hartigan to be done  ASAP due to weight loss and pain That office instructed him to call and ask that  Dr. Clent Ridges  contact Gastroenterology to expedite the process if possible, otherwise he cannot be seen before 02/16/13.  Reviewed Health History In EMR: Yes  Reviewed Medications In EMR: Yes  Reviewed Allergies In EMR: Yes  Reviewed Surgeries / Procedures: Yes  Date of Onset of Symptoms: Unknown  Guideline(s) Used:  No Protocol Available - Information Only  Disposition Per Guideline:   Discuss with PCP and Callback by Nurse Today  Reason For Disposition Reached:   Nursing judgment  Advice Given:  Call Back If:  New symptoms develop  You become worse.

## 2013-02-01 NOTE — Telephone Encounter (Signed)
Pt would like results of labs done Fri.

## 2013-02-03 ENCOUNTER — Telehealth: Payer: Self-pay | Admitting: Family Medicine

## 2013-02-03 DIAGNOSIS — R109 Unspecified abdominal pain: Secondary | ICD-10-CM

## 2013-02-03 NOTE — Telephone Encounter (Signed)
Please see below note

## 2013-02-03 NOTE — Telephone Encounter (Signed)
done

## 2013-02-03 NOTE — Telephone Encounter (Signed)
Please have dr. Clent Ridges put in new referral... Referral to [facility] is already authorized. Thanks!

## 2013-02-03 NOTE — Telephone Encounter (Signed)
Pt has made his own appt at a GI doctor. Jarold Song. Does not want to wait to see  Dr Christella Hartigan on 02/16/13. The only way he can keep appt is if Dr Clent Ridges will do the referral.  Please send all notes and referral information to Dr. Carman Ching fax: [phone] Attn: Dr Randa Evens Pt said he checked w/ all [facility] docs and no one had anything prior to this date. Pt anxious to get appt

## 2013-02-10 ENCOUNTER — Other Ambulatory Visit: Payer: Self-pay | Admitting: Gastroenterology

## 2013-02-10 DIAGNOSIS — R1011 Right upper quadrant pain: Secondary | ICD-10-CM

## 2013-02-11 ENCOUNTER — Ambulatory Visit
Admission: RE | Admit: 2013-02-11 | Discharge: 2013-02-11 | Disposition: A | Discharge status: Home / Self Care | Payer: BC Managed Care – PPO | Source: Ambulatory Visit | Attending: Gastroenterology | Admitting: Gastroenterology

## 2013-02-11 DIAGNOSIS — R1011 Right upper quadrant pain: Secondary | ICD-10-CM

## 2013-02-16 ENCOUNTER — Ambulatory Visit: Payer: BC Managed Care – PPO | Admitting: Gastroenterology

## 2013-05-19 ENCOUNTER — Other Ambulatory Visit: Payer: Self-pay | Admitting: Family Medicine

## 2013-06-09 ENCOUNTER — Encounter: Payer: Self-pay | Admitting: Family Medicine

## 2013-06-09 ENCOUNTER — Ambulatory Visit (INDEPENDENT_AMBULATORY_CARE_PROVIDER_SITE_OTHER): Payer: BC Managed Care – PPO | Admitting: Family Medicine

## 2013-06-09 VITALS — BP 150/94 | HR 81 | Temp 98.5°F | Wt 206.0 lb

## 2013-06-09 DIAGNOSIS — H6120 Impacted cerumen, unspecified ear: Secondary | ICD-10-CM

## 2013-06-09 DIAGNOSIS — H6122 Impacted cerumen, left ear: Secondary | ICD-10-CM

## 2013-06-09 DIAGNOSIS — I10 Essential (primary) hypertension: Secondary | ICD-10-CM

## 2013-06-09 MED ORDER — AMLODIPINE BESYLATE 10 MG PO TABS: 10.0000 mg | ORAL_TABLET | Freq: Every day | ORAL | Status: DC

## 2013-06-09 NOTE — Progress Notes (Signed)
  Subjective:    Patient ID: Michael Parker, male    DOB: 11-01-1966, 47 y.o.   MRN: [id number]  HPI Here for 2 things. First his left ear is stopped up again and is hard to hear out of. No pain. Also his BP remains a little high at home, usually in the 140s over 90s. His IBS has greatly improved, and he has gained some weight back.    Review of Systems  Constitutional: Negative.   HENT: Positive for hearing loss. Negative for ear pain and ear discharge.   Respiratory: Negative.   Cardiovascular: Negative.        Objective:   Physical Exam  Constitutional: He appears well-developed and well-nourished.  HENT:  Right Ear: External ear normal.  Left ear canal is full of cerumen   Cardiovascular: Normal rate, regular rhythm, normal heart sounds and intact distal pulses.   Pulmonary/Chest: Effort normal and breath sounds normal.          Assessment & Plan:  The ear was irrigated clear with water. Increase Amlodipine to 10 mg a day.

## 2013-10-14 ENCOUNTER — Other Ambulatory Visit: Payer: Self-pay

## 2013-10-22 ENCOUNTER — Ambulatory Visit (INDEPENDENT_AMBULATORY_CARE_PROVIDER_SITE_OTHER): Payer: BC Managed Care – PPO | Admitting: Family

## 2013-10-22 ENCOUNTER — Encounter: Payer: Self-pay | Admitting: Family

## 2013-10-22 VITALS — BP 138/80 | HR 89 | Wt 209.0 lb

## 2013-10-22 DIAGNOSIS — M545 Low back pain, unspecified: Secondary | ICD-10-CM

## 2013-10-22 DIAGNOSIS — IMO0002 Reserved for concepts with insufficient information to code with codable children: Secondary | ICD-10-CM

## 2013-10-22 DIAGNOSIS — M5416 Radiculopathy, lumbar region: Secondary | ICD-10-CM

## 2013-10-22 MED ORDER — KETOROLAC TROMETHAMINE 60 MG/2ML IM SOLN: 60.0000 mg | Freq: Once | INTRAMUSCULAR | Status: DC

## 2013-10-22 MED ORDER — PREDNISONE 20 MG PO TABS: ORAL_TABLET | ORAL | Status: AC

## 2013-10-22 MED ORDER — KETOROLAC TROMETHAMINE 60 MG/2ML IM SOLN
60.0000 mg | Freq: Once | INTRAMUSCULAR | Status: AC
  Administered 2013-10-22: 60 mg via INTRAMUSCULAR

## 2013-10-22 MED ORDER — TRAMADOL HCL 50 MG PO TABS: 50.0000 mg | ORAL_TABLET | Freq: Three times a day (TID) | ORAL | Status: DC | PRN

## 2013-10-22 NOTE — Addendum Note (Signed)
Addended by: Beverely Low on: 10/22/2013 11:31 AM   Modules accepted: Orders

## 2013-10-22 NOTE — Progress Notes (Signed)
Subjective:    Patient ID: Jemell Town, male    DOB: Nov 09, 1966, 47 y.o.   MRN: [id number]  HPI  47 year old white male, nonsmoker is in today with complaints of low back pain x1 week without any relief. He recently in 8/10, not responding to ibuprofen that he can take every 6 hours. The pain radiates down his right leg. He has a long-standing history of low back pain that flares from time to time dating back to 25 years ago. The pain is worse with lying down, but better with resting. Describes the pain as a dull ache with occasional spasm.  Review of Systems  Constitutional: Negative.   Respiratory: Negative.   Cardiovascular: Negative.   Gastrointestinal: Negative.   Musculoskeletal: Positive for back pain.  Psychiatric/Behavioral: Negative.    Past Medical History  Diagnosis Date  . Hypertension   . Chickenpox   . CAD (coronary artery disease)     mild calciium score of 49  . Obesity   . Hyperlipemia   . Irritable bowel syndrome (IBS)     sees Dr. Carman Ching    History   Social History  . Marital Status: Married    Spouse Name: N/A    Number of Children: N/A  . Years of Education: N/A   Occupational History  . Not on file.   Social History Main Topics  . Smoking status: Never Smoker   . Smokeless tobacco: Never Used  . Alcohol Use: No  . Drug Use: No  . Sexual Activity: Not on file   Other Topics Concern  . Not on file   Social History Narrative  . No narrative on file    History reviewed. No pertinent past surgical history.  Family History  Problem Relation Age of Onset  . Depression      family hx  . Hyperlipidemia      family hx  . Hypertension      family hx  . Sudden death      family hx     Allergies  Allergen Reactions  . Codeine     REACTION: hallucinations    Current Outpatient Prescriptions on File Prior to Visit  Medication Sig Dispense Refill  . amLODipine (NORVASC) 10 MG tablet Take 1 tablet (10 mg total) by mouth daily.   90 tablet  3  . dicyclomine (BENTYL) 20 MG tablet Take 20 mg by mouth 2 (two) times daily as needed.       No current facility-administered medications on file prior to visit.    BP 138/80  Pulse 89  Wt 209 lb (94.802 kg)chart    Objective:   Physical Exam  Constitutional: He is oriented to person, place, and time. He appears well-developed and well-nourished.  Neck: Normal range of motion. Neck supple.  Cardiovascular: Normal rate, regular rhythm and normal heart sounds.   Pulmonary/Chest: Effort normal and breath sounds normal.  Musculoskeletal: He exhibits tenderness.  Tenderness to palpation of the lower lumbar spine to the right. Pain with flexion of the head at about 20. Lateral movement normal. Positive straight leg raise maneuver on the right.  Neurological: He is alert and oriented to person, place, and time. No cranial nerve deficit. Coordination normal.  Skin: Skin is warm and dry.  Psychiatric: He has a normal mood and affect.          Assessment & Plan:  Assessment: 1. Low back pain 2. Lumbar radiculopathy  Plan: Prednisone 60x3, 40x3, 20x3.. tramadol 50 mg every  8 hours as needed for pain. Patient are the office with any questions or concerns. Recheck as scheduled, and as needed. Low back strengthening exercises provided today.

## 2013-10-22 NOTE — Progress Notes (Signed)
Pre visit review using our clinic review tool, if applicable. No additional management support is needed unless otherwise documented below in the visit note. 

## 2013-10-22 NOTE — Patient Instructions (Addendum)
Sciatica Sciatica is pain, weakness, numbness, or tingling along the path of the sciatic nerve. The nerve starts in the lower back and runs down the back of each leg. The nerve controls the muscles in the lower leg and in the back of the knee, while also providing sensation to the back of the thigh, lower leg, and the sole of your foot. Sciatica is a symptom of another medical condition. For instance, nerve damage or certain conditions, such as a herniated disk or bone spur on the spine, pinch or put pressure on the sciatic nerve. This causes the pain, weakness, or other sensations normally associated with sciatica. Generally, sciatica only affects one side of the body. CAUSES   Herniated or slipped disc.  Degenerative disk disease.  A pain disorder involving the narrow muscle in the buttocks (piriformis syndrome).  Pelvic injury or fracture.  Pregnancy.  Tumor (rare). SYMPTOMS  Symptoms can vary from mild to very severe. The symptoms usually travel from the low back to the buttocks and down the back of the leg. Symptoms can include:  Mild tingling or dull aches in the lower back, leg, or hip.  Numbness in the back of the calf or sole of the foot.  Burning sensations in the lower back, leg, or hip.  Sharp pains in the lower back, leg, or hip.  Leg weakness.  Severe back pain inhibiting movement. These symptoms may get worse with coughing, sneezing, laughing, or prolonged sitting or standing. Also, being overweight may worsen symptoms. DIAGNOSIS  Your caregiver will perform a physical exam to look for common symptoms of sciatica. He or she may ask you to do certain movements or activities that would trigger sciatic nerve pain. Other tests may be performed to find the cause of the sciatica. These may include:  Blood tests.  X-rays.  Imaging tests, such as an MRI or CT scan. TREATMENT  Treatment is directed at the cause of the sciatic pain. Sometimes, treatment is not necessary  and the pain and discomfort goes away on its own. If treatment is needed, your caregiver may suggest:  Over-the-counter medicines to relieve pain.  Prescription medicines, such as anti-inflammatory medicine, muscle relaxants, or narcotics.  Applying heat or ice to the painful area.  Steroid injections to lessen pain, irritation, and inflammation around the nerve.  Reducing activity during periods of pain.  Exercising and stretching to strengthen your abdomen and improve flexibility of your spine. Your caregiver may suggest losing weight if the extra weight makes the back pain worse.  Physical therapy.  Surgery to eliminate what is pressing or pinching the nerve, such as a bone spur or part of a herniated disk. HOME CARE INSTRUCTIONS   Only take over-the-counter or prescription medicines for pain or discomfort as directed by your caregiver.  Apply ice to the affected area for 20 minutes, 3 4 times a day for the first 48 72 hours. Then try heat in the same way.  Exercise, stretch, or perform your usual activities if these do not aggravate your pain.  Attend physical therapy sessions as directed by your caregiver.  Keep all follow-up appointments as directed by your caregiver.  Do not wear high heels or shoes that do not provide proper support.  Check your mattress to see if it is too soft. A firm mattress may lessen your pain and discomfort. SEEK IMMEDIATE MEDICAL CARE IF:   You lose control of your bowel or bladder (incontinence).  You have increasing weakness in the lower back,   pelvis, buttocks, or legs.  You have redness or swelling of your back.  You have a burning sensation when you urinate.  You have pain that gets worse when you lie down or awakens you at night.  Your pain is worse than you have experienced in the past.  Your pain is lasting longer than 4 weeks.  You are suddenly losing weight without reason. MAKE SURE YOU:  Understand these  instructions.  Will watch your condition.  Will get help right away if you are not doing well or get worse. Document Released: 11/19/2001 Document Revised: 05/26/2012 Document Reviewed: 04/05/2012 [facility] Patient Information 2014 [place], [place].  Back Exercises These exercises may help you when beginning to rehabilitate your injury. Your symptoms may resolve with or without further involvement from your physician, physical therapist or athletic trainer. While completing these exercises, remember:   Restoring tissue flexibility helps normal motion to return to the joints. This allows healthier, less painful movement and activity.  An effective stretch should be held for at least 30 seconds.  A stretch should never be painful. You should only feel a gentle lengthening or release in the stretched tissue. STRETCH  Extension, Prone on Elbows   Lie on your stomach on the floor, a bed will be too soft. Place your palms about shoulder width apart and at the height of your head.  Place your elbows under your shoulders. If this is too painful, stack pillows under your chest.  Allow your body to relax so that your hips drop lower and make contact more completely with the floor.  Hold this position for __________ seconds.  Slowly return to lying flat on the floor. Repeat __________ times. Complete this exercise __________ times per day.  RANGE OF MOTION  Extension, Prone Press Ups   Lie on your stomach on the floor, a bed will be too soft. Place your palms about shoulder width apart and at the height of your head.  Keeping your back as relaxed as possible, slowly straighten your elbows while keeping your hips on the floor. You may adjust the placement of your hands to maximize your comfort. As you gain motion, your hands will come more underneath your shoulders.  Hold this position __________ seconds.  Slowly return to lying flat on the floor. Repeat __________ times. Complete this  exercise __________ times per day.  RANGE OF MOTION- Quadruped, Neutral Spine   Assume a hands and knees position on a firm surface. Keep your hands under your shoulders and your knees under your hips. You may place padding under your knees for comfort.  Drop your head and point your tail bone toward the ground below you. This will round out your low back like an angry cat. Hold this position for __________ seconds.  Slowly lift your head and release your tail bone so that your back sags into a large arch, like an old horse.  Hold this position for __________ seconds.  Repeat this until you feel limber in your low back.  Now, find your "sweet spot." This will be the most comfortable position somewhere between the two previous positions. This is your neutral spine. Once you have found this position, tense your stomach muscles to support your low back.  Hold this position for __________ seconds. Repeat __________ times. Complete this exercise __________ times per day.  STRETCH  Flexion, Single Knee to Chest   Lie on a firm bed or floor with both legs extended in front of you.  Keeping one  leg in contact with the floor, bring your opposite knee to your chest. Hold your leg in place by either grabbing behind your thigh or at your knee.  Pull until you feel a gentle stretch in your low back. Hold __________ seconds.  Slowly release your grasp and repeat the exercise with the opposite side. Repeat __________ times. Complete this exercise __________ times per day.  STRETCH - Hamstrings, Standing  Stand or sit and extend your right / left leg, placing your foot on a chair or foot stool  Keeping a slight arch in your low back and your hips straight forward.  Lead with your chest and lean forward at the waist until you feel a gentle stretch in the back of your right / left knee or thigh. (When done correctly, this exercise requires leaning only a small distance.)  Hold this position for  __________ seconds. Repeat __________ times. Complete this stretch __________ times per day. STRENGTHENING  Deep Abdominals, Pelvic Tilt   Lie on a firm bed or floor. Keeping your legs in front of you, bend your knees so they are both pointed toward the ceiling and your feet are flat on the floor.  Tense your lower abdominal muscles to press your low back into the floor. This motion will rotate your pelvis so that your tail bone is scooping upwards rather than pointing at your feet or into the floor.  With a gentle tension and even breathing, hold this position for __________ seconds. Repeat __________ times. Complete this exercise __________ times per day.  STRENGTHENING  Abdominals, Crunches   Lie on a firm bed or floor. Keeping your legs in front of you, bend your knees so they are both pointed toward the ceiling and your feet are flat on the floor. Cross your arms over your chest.  Slightly tip your chin down without bending your neck.  Tense your abdominals and slowly lift your trunk high enough to just clear your shoulder blades. Lifting higher can put excessive stress on the low back and does not further strengthen your abdominal muscles.  Control your return to the starting position. Repeat __________ times. Complete this exercise __________ times per day.  STRENGTHENING  Quadruped, Opposite UE/LE Lift   Assume a hands and knees position on a firm surface. Keep your hands under your shoulders and your knees under your hips. You may place padding under your knees for comfort.  Find your neutral spine and gently tense your abdominal muscles so that you can maintain this position. Your shoulders and hips should form a rectangle that is parallel with the floor and is not twisted.  Keeping your trunk steady, lift your right hand no higher than your shoulder and then your left leg no higher than your hip. Make sure you are not holding your breath. Hold this position __________  seconds.  Continuing to keep your abdominal muscles tense and your back steady, slowly return to your starting position. Repeat with the opposite arm and leg. Repeat __________ times. Complete this exercise __________ times per day. Document Released: 12/13/2005 Document Revised: 02/17/2012 Document Reviewed: 03/09/2009 [facility] Patient Information 2014 [place], [place].

## 2013-12-01 IMAGING — CT CT ABD-PELV W/ CM
2 of 5 series · 17 of 46 positions shown, 19 images · IV contrast (APPLIED)
Comparison: None

CLINICAL DATA: Epigastric periumbilical abdominal pain.  Nausea.
Loose stools.  History of coronary artery disease, obesity,
diabetes.

CT ABDOMEN AND PELVIS WITH CONTRAST
TECHNIQUE: Multidetector CT imaging of the abdomen and pelvis was
performed following the standard protocol during bolus
administration of intravenous contrast.
Contrast: 1 OMNIPAQUE IOHEXOL 300 MG/ML  SOLN, 80mL OMNIPAQUE
IOHEXOL 300 MG/ML  SOLN

[Series 2: abd/pelv with 5.0 b31f st · axial · 0.72mm/px · z∈[-478,-52]mm · 14 of 97 slices shown, 16 images]
[im 6/97  soft-tissue]
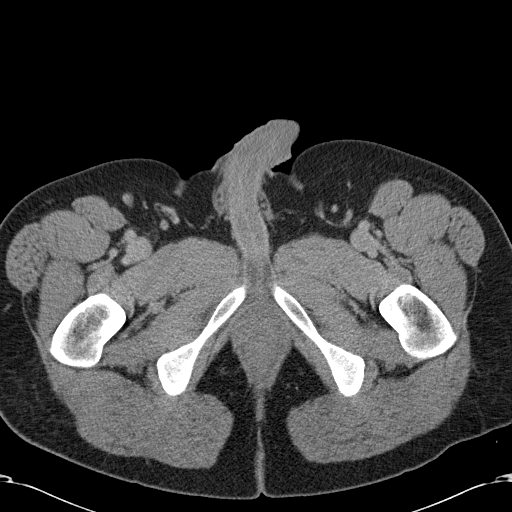
[im 6/97  bone]
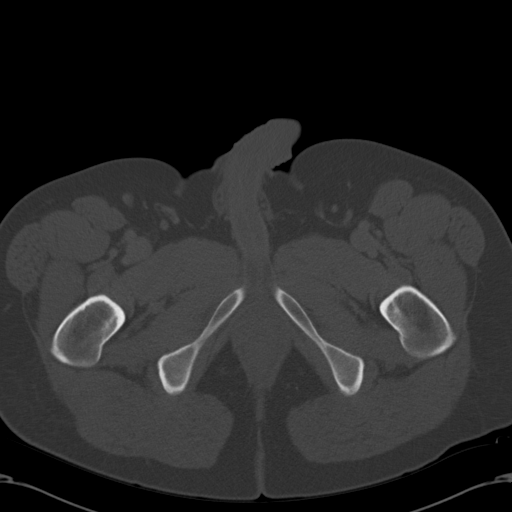
[im 11/97  soft-tissue]
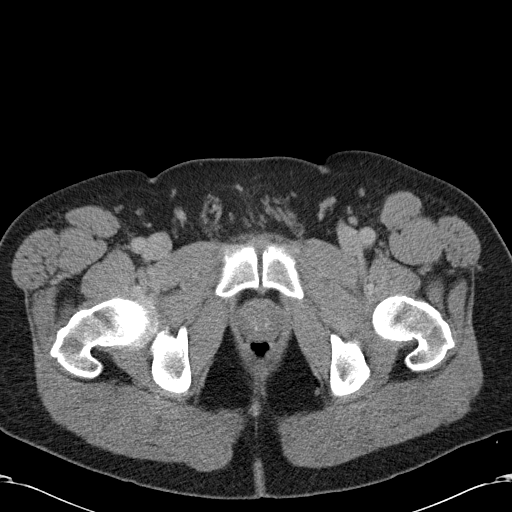
[im 21/97  soft-tissue]
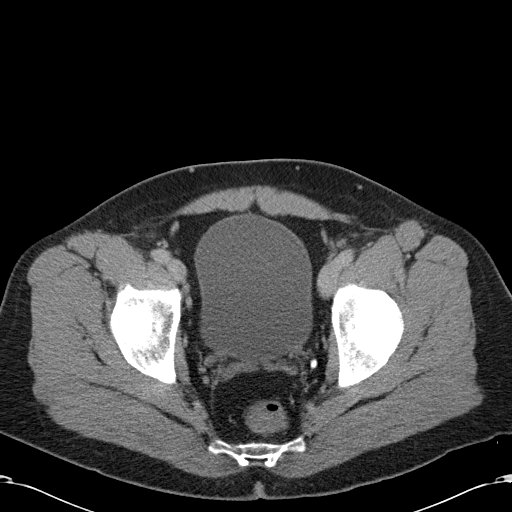
[im 26/97  soft-tissue]
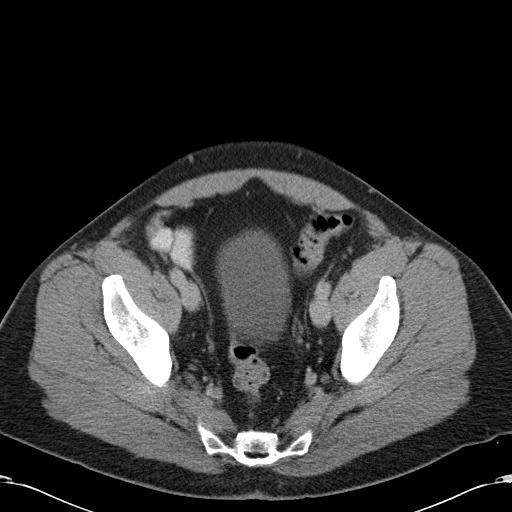
[im 31/97  soft-tissue]
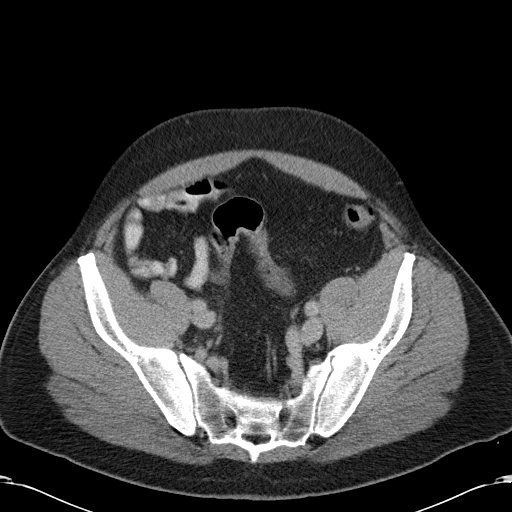
[im 41/97  soft-tissue]
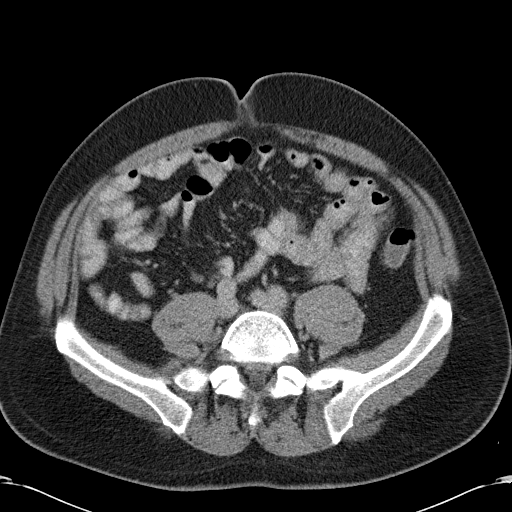
[im 46/97  soft-tissue]
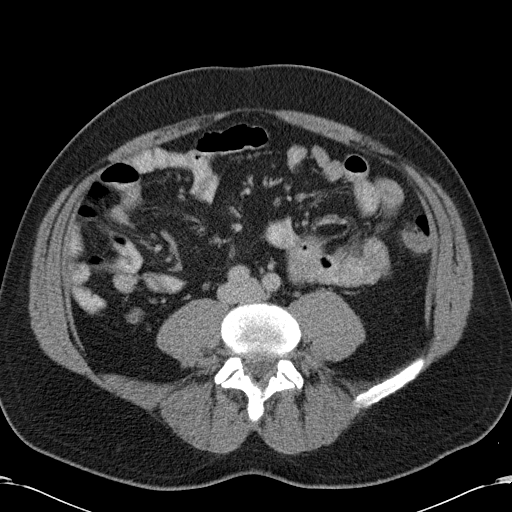
[im 51/97  soft-tissue]
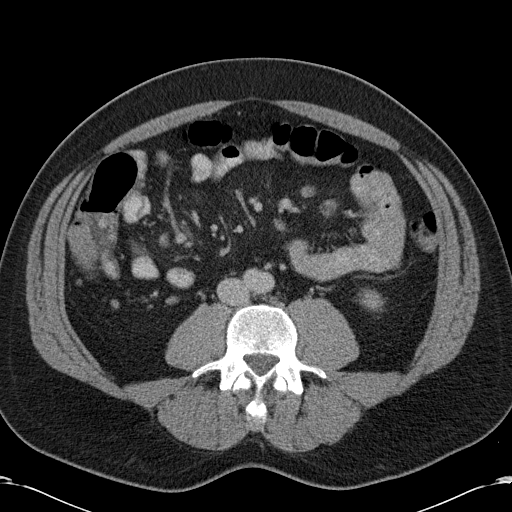
[im 56/97  soft-tissue]
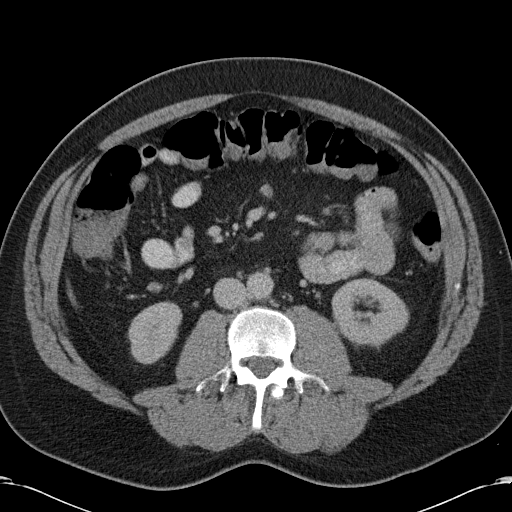
[im 56/97  bone]
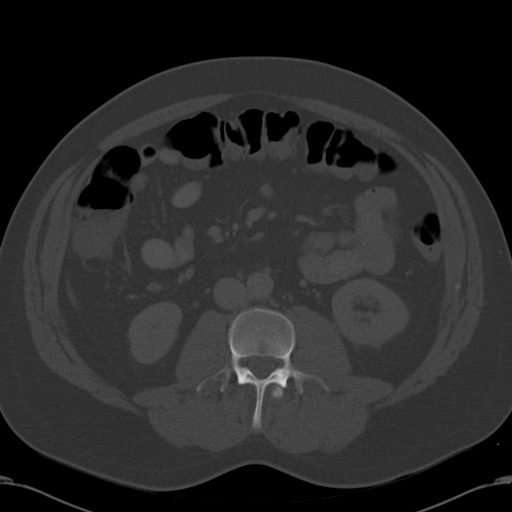
[im 66/97  soft-tissue]
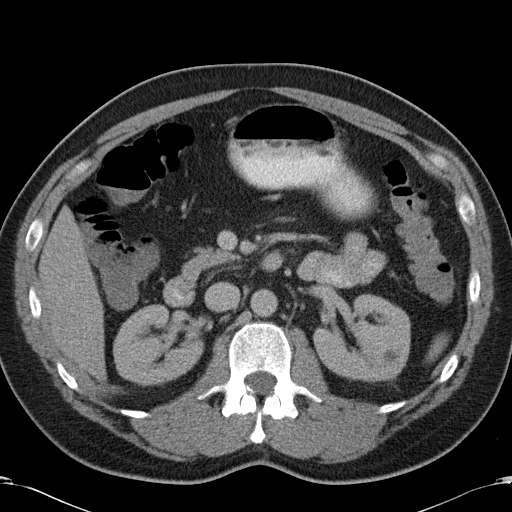
[im 71/97  soft-tissue]
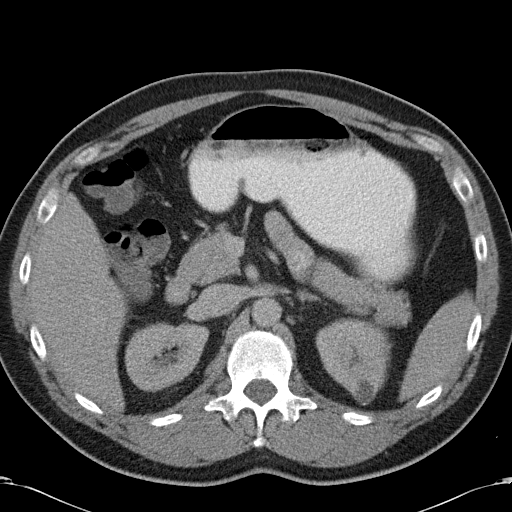
[im 76/97  soft-tissue]
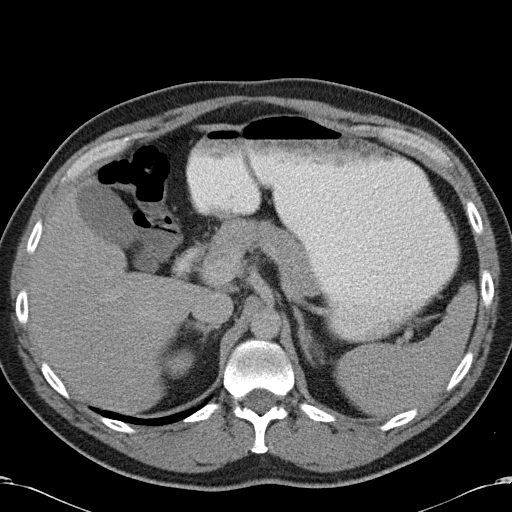
[im 86/97  soft-tissue]
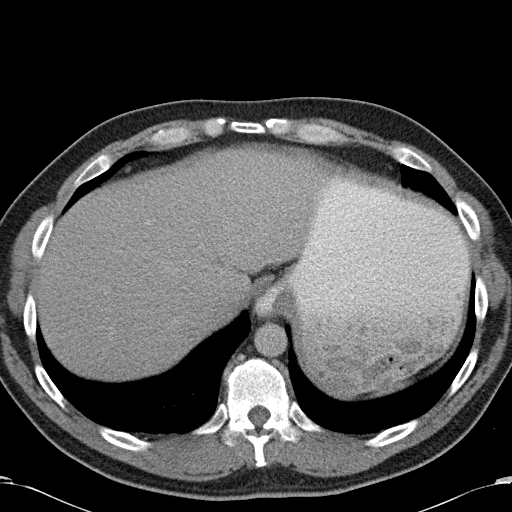
[im 91/97  soft-tissue]
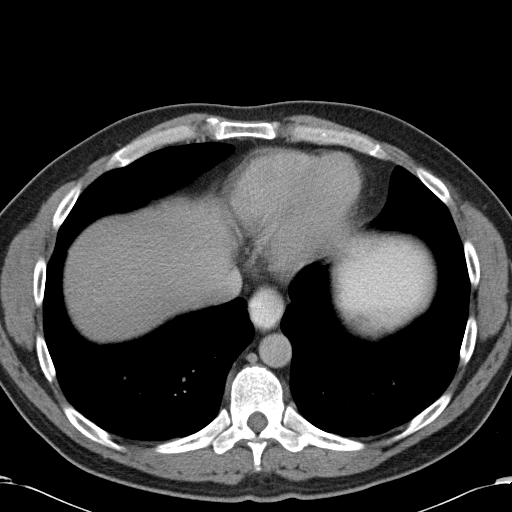

[Series 5: coronals · coronal · 0.93mm/px · 3 of 141 slices shown]
[im 47/141  soft-tissue]
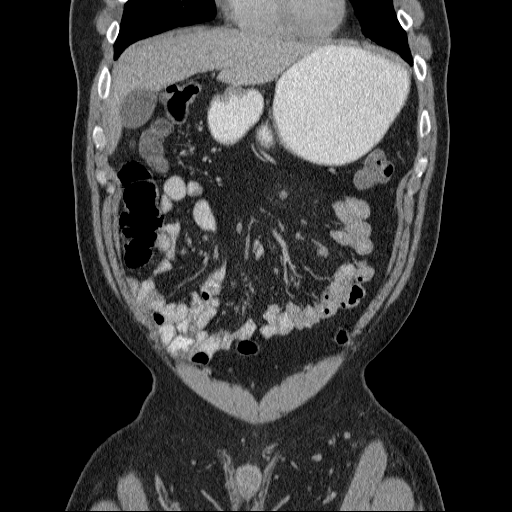
[im 63/141  soft-tissue]
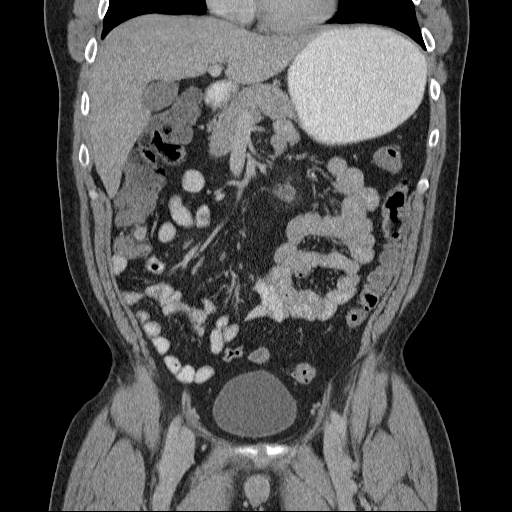
[im 78/141  soft-tissue]
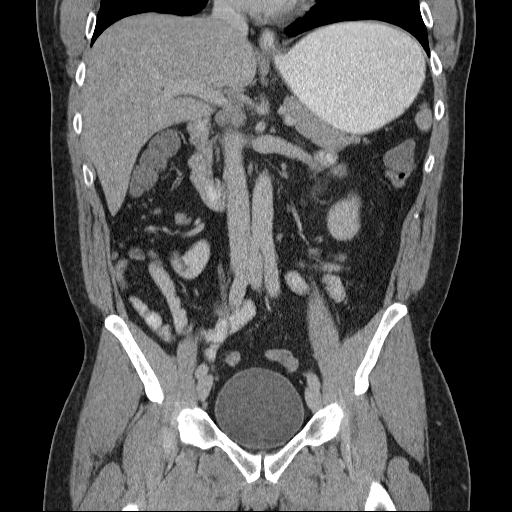

[17 of 46 positions shown; findings below may reference images not displayed]

FINDINGS: Lung bases:  Clear lung bases.  Normal heart size without
pericardial or pleural effusion.  The lower thoracic esophagus is
dilated and contrast filled.

Abdomen/pelvis:  Normal liver, spleen, distal stomach, pancreas,
gallbladder, biliary tract, adrenal glands. Right renal sub
centimeter cyst.  An upper pole left renal cyst or minimally
complex cyst.  Bilateral too small to characterize renal lesions.

No retroperitoneal or retrocrural adenopathy.

Normal colon and terminal ileum.  Normal appendix, including on
image 48/series 2. Normal small bowel without abdominal ascites.

There are prominent nodes within the jejunal mesentery with
increased mesenteric fat density.  Example image 34/series 2.

No pelvic adenopathy.    Normal urinary bladder and prostate.  No
significant free fluid.

Bones/Musculoskeletal:  Degenerative partial fusion of the left
sacroiliac joint.  Advanced degenerative disc disease at the
lumbosacral junction.
IMPRESSION: 1.  Findings suspicious for mesenteric adenitis/panniculitis.
2.  Normal appendix, without other explanation for abdominal pain.
3. Esophageal air fluid level suggests dysmotility or
gastroesophageal reflux.

## 2013-12-22 ENCOUNTER — Ambulatory Visit (INDEPENDENT_AMBULATORY_CARE_PROVIDER_SITE_OTHER): Payer: BC Managed Care – PPO | Admitting: Family Medicine

## 2013-12-22 ENCOUNTER — Encounter: Payer: Self-pay | Admitting: Family Medicine

## 2013-12-22 VITALS — BP 154/90 | HR 93 | Temp 99.1°F | Wt 214.0 lb

## 2013-12-22 DIAGNOSIS — H8110 Benign paroxysmal vertigo, unspecified ear: Secondary | ICD-10-CM

## 2013-12-22 DIAGNOSIS — J019 Acute sinusitis, unspecified: Secondary | ICD-10-CM

## 2013-12-22 MED ORDER — AZITHROMYCIN 250 MG PO TABS: ORAL_TABLET | ORAL | Status: DC

## 2013-12-22 NOTE — Progress Notes (Signed)
Pre visit review using our clinic review tool, if applicable. No additional management support is needed unless otherwise documented below in the visit note. 

## 2013-12-22 NOTE — Progress Notes (Signed)
   Subjective:    Patient ID: Michael Parker, male    DOB: 1966-10-02, 48 y.o.   MRN: [id number]  HPI Here for 2 days of intermittent dizziness which makes him feel unsteady on his feet. No HA or nausea. For 2 weeks he has also had sinus pressure, PND, and a dry cough.    Review of Systems  Constitutional: Negative.   HENT: Positive for congestion, postnasal drip and sinus pressure.   Eyes: Negative.   Respiratory: Negative.   Cardiovascular: Negative.   Neurological: Positive for dizziness. Negative for tremors, seizures, syncope, facial asymmetry, speech difficulty, weakness, light-headedness, numbness and headaches.       Objective:   Physical Exam  Constitutional: He is oriented to person, place, and time. He appears well-developed and well-nourished. No distress.  HENT:  Right Ear: External ear normal.  Left Ear: External ear normal.  Nose: Nose normal.  Mouth/Throat: Oropharynx is clear and moist.  Lymphadenopathy:    He has no cervical adenopathy.  Neurological: He is alert and oriented to person, place, and time.          Assessment & Plan:  He has a sinusitis that is causing some vertigo. Treat with a Zpack and add Claritin once a day and Mucinex bid.

## 2014-01-03 ENCOUNTER — Telehealth: Payer: Self-pay | Admitting: Family Medicine

## 2014-01-03 NOTE — Telephone Encounter (Signed)
Call in Nystatin oral susp. To swish 5 ml in the mouth every 4 hours prn, 200 ml with one rf

## 2014-01-03 NOTE — Telephone Encounter (Signed)
Pt just finished abx and now has thrush in his mouth. [facility] [place]

## 2014-01-04 MED ORDER — NYSTATIN 100000 UNIT/ML MT SUSP: 5.0000 mL | OROMUCOSAL | Status: DC | PRN

## 2014-01-04 NOTE — Telephone Encounter (Signed)
I called in script and left a voice message for pt.

## 2014-01-05 ENCOUNTER — Telehealth: Payer: Self-pay | Admitting: Family Medicine

## 2014-01-05 NOTE — Telephone Encounter (Signed)
I spoke with Cyril Mourning and went over directions.

## 2014-01-05 NOTE — Telephone Encounter (Signed)
Kristen at [facility] needs clarification on directions for nystatin (MYCOSTATIN) 100000 UNIT/ML suspension.  E-scribe directions say one thing but verbalized something different yesterday when spoke with Sunday Spillers.  Please call Cyril Mourning and confirm directions.

## 2014-01-26 ENCOUNTER — Ambulatory Visit: Payer: BC Managed Care – PPO | Admitting: Internal Medicine

## 2014-01-28 ENCOUNTER — Encounter: Payer: Self-pay | Admitting: Internal Medicine

## 2014-01-28 ENCOUNTER — Ambulatory Visit (INDEPENDENT_AMBULATORY_CARE_PROVIDER_SITE_OTHER): Payer: BC Managed Care – PPO | Admitting: Internal Medicine

## 2014-01-28 VITALS — BP 140/88 | HR 84 | Ht 73.0 in | Wt 207.8 lb

## 2014-01-28 DIAGNOSIS — R002 Palpitations: Secondary | ICD-10-CM

## 2014-01-28 DIAGNOSIS — Z8249 Family history of ischemic heart disease and other diseases of the circulatory system: Secondary | ICD-10-CM

## 2014-01-28 DIAGNOSIS — R42 Dizziness and giddiness: Secondary | ICD-10-CM

## 2014-01-28 DIAGNOSIS — I10 Essential (primary) hypertension: Secondary | ICD-10-CM

## 2014-01-28 DIAGNOSIS — R0789 Other chest pain: Secondary | ICD-10-CM

## 2014-01-28 DIAGNOSIS — E785 Hyperlipidemia, unspecified: Secondary | ICD-10-CM

## 2014-01-28 NOTE — Patient Instructions (Signed)
Your physician recommends that you schedule a follow-up appointment in: 6 weeks to discuss test  Your physician has requested that you have an exercise tolerance test. For further information please visit [email or web address]. Please also follow instruction sheet, as given.   Your physician has recommended that you wear an event monitor. Event monitors are medical devices that record the heart's electrical activity. Doctors most often [place] these monitors to diagnose arrhythmias. Arrhythmias are problems with the speed or rhythm of the heartbeat. The monitor is a small, portable device. You can wear one while you do your normal daily activities. This is usually used to diagnose what is causing palpitations/syncope (passing out).  Your physician has requested that you have an echocardiogram. Echocardiography is a painless test that uses sound waves to create images of your heart. It provides your doctor with information about the size and shape of your heart and how well your heart's chambers and valves are working. This procedure takes approximately one hour. There are no restrictions for this procedure.  Your physician recommends that you return for lab work fasting: Lipid/BMP/Mag/TSH

## 2014-01-30 ENCOUNTER — Encounter: Payer: Self-pay | Admitting: Internal Medicine

## 2014-01-30 DIAGNOSIS — R002 Palpitations: Secondary | ICD-10-CM | POA: Insufficient documentation

## 2014-01-30 DIAGNOSIS — R0789 Other chest pain: Secondary | ICD-10-CM | POA: Insufficient documentation

## 2014-01-30 DIAGNOSIS — R42 Dizziness and giddiness: Secondary | ICD-10-CM | POA: Insufficient documentation

## 2014-01-30 NOTE — Progress Notes (Signed)
Primary Care Physician: Laurey Morale, MD   Michael Parker is a 48 y.o. male with a h/o hypertension who presents for cardiology evaluation.  He is typically quite healthy and exercises regularly (until recent back herniation). He feels that his exercise tolerance remains stable. He has occasional palpitations.  These last only 1-2 seconds.  He describes these as a "skipped heart beat" occasionally associated with dizziness or breathlessness.  He has family history of CAD and therefore presents today for cardiology assessment. He reports occasional dizziness which appears to be mostly postural. He denies presyncope or syncope. He does have occasional chest tightness.  He is mostly concerned with decreased activity level recently due to back pain.  He has been told that he has a herniated disc.  Today, he denies symptoms of shortness of breath, orthopnea, PND, lower extremity edema, or neurologic sequela.   Past Medical History  Diagnosis Date  . Hypertension   . Chickenpox   . Obesity   . Hyperlipemia   . Irritable bowel syndrome (IBS)     sees Dr. Laurence Spates  . Herniated disc    Past Surgical History  Procedure Laterality Date  . None      Current Outpatient Prescriptions  Medication Sig Dispense Refill  . amLODipine (NORVASC) 10 MG tablet Take 1 tablet (10 mg total) by mouth daily.  90 tablet  3  . dicyclomine (BENTYL) 20 MG tablet Take 20 mg by mouth 2 (two) times daily as needed.      . nystatin (MYCOSTATIN) 100000 UNIT/ML suspension Take 5 mLs (500,000 Units total) by mouth every 4 (four) hours as needed. Swish  200 mL  1   No current facility-administered medications for this visit.    Allergies  Allergen Reactions  . Codeine     REACTION: hallucinations    History   Social History  . Marital Status: Married    Spouse Name: N/A    Number of Children: N/A  . Years of Education: N/A   Occupational History  . Not on file.   Social History Main Topics  . Smoking  status: Never Smoker   . Smokeless tobacco: Never Used  . Alcohol Use: No  . Drug Use: No  . Sexual Activity: Not on file   Other Topics Concern  . Not on file   Social History Narrative  . No narrative on file    Family History  Problem Relation Age of Onset  . Depression      family hx  . Hyperlipidemia      family hx  . Hypertension      family hx  . Sudden death Cousin 55  . Heart attack Father     multiple MIs  . Aneurysm Father     ROS- All systems are reviewed and negative except as per the HPI above  Physical Exam: Filed Vitals:   01/28/14 0850  BP: 140/88  Pulse: 84  Height: 6\' 1"  (1.854 m)  Weight: 207 lb 12.8 oz (94.257 kg)    GEN- The patient is well appearing, alert and oriented x 3 today.   Head- normocephalic, atraumatic Eyes-  Sclera clear, conjunctiva pink Ears- hearing intact Oropharynx- clear Neck- supple, no JVP Lymph- no cervical lymphadenopathy Lungs- Clear to ausculation bilaterally, normal work of breathing Heart- Regular rate and rhythm, no murmurs, rubs or gallops, PMI not laterally displaced GI- soft, NT, ND, + BS Extremities- no clubbing, cyanosis, or edema MS- no significant deformity or atrophy Skin- no rash  or lesion Psych- euthymic mood, full affect Neuro- strength and sensation are intact  EKG today reveals sinus rhythm 84 bpm, PR 146, QRS 88, QTc 430, otherwise normal ekg  Assessment and Plan:  1. Palpitations/ dizziness History is suggestive of pacs/pvcs.  His ekg is normal today.  I suspect that these are benign.  He does have family history of sudden death as well as early onset CAD.  I therefore think that additional workup is necessary.  I will obtain an echo and gxt.  A 30 day monitor will be placed to better evaluate his palpitations.  I will obtain a BMET,Mg, and TSH.  2. Chest tightness Likely a component of anxiety As above, given his family history of CAD, I will obtain a GXT.  I will also obtain a fasting  lipid profile.  3. HTN bmet Regular exercise is encouraged  4. HL- diagnosis previously listed by Dr Sarajane Jews Fasting lipids  Return in 6 weeks for followup

## 2014-01-31 ENCOUNTER — Other Ambulatory Visit: Payer: BC Managed Care – PPO

## 2014-02-01 ENCOUNTER — Encounter: Payer: Self-pay | Admitting: *Deleted

## 2014-02-01 ENCOUNTER — Encounter (INDEPENDENT_AMBULATORY_CARE_PROVIDER_SITE_OTHER): Payer: BC Managed Care – PPO

## 2014-02-01 ENCOUNTER — Other Ambulatory Visit (INDEPENDENT_AMBULATORY_CARE_PROVIDER_SITE_OTHER): Payer: BC Managed Care – PPO

## 2014-02-01 DIAGNOSIS — I10 Essential (primary) hypertension: Secondary | ICD-10-CM

## 2014-02-01 DIAGNOSIS — R002 Palpitations: Secondary | ICD-10-CM

## 2014-02-01 DIAGNOSIS — Z8249 Family history of ischemic heart disease and other diseases of the circulatory system: Secondary | ICD-10-CM

## 2014-02-01 LAB — BASIC METABOLIC PANEL
BUN: 17 mg/dL (ref 6–23)
CALCIUM: 9.4 mg/dL (ref 8.4–10.5)
CHLORIDE: 103 meq/L (ref 96–112)
CO2: 29 meq/L (ref 19–32)
CREATININE: 1 mg/dL (ref 0.4–1.5)
GFR: 83.87 mL/min (ref >60.00)
Glucose, Bld: 103 mg/dL — ABNORMAL HIGH (ref 70–99)
Potassium: 4 mEq/L (ref 3.5–5.1)
Sodium: 139 mEq/L (ref 135–145)

## 2014-02-01 LAB — LIPID PANEL
Cholesterol: 153 mg/dL (ref 0–200)
HDL: 53.1 mg/dL (ref >39.00)
LDL CALC: 91 mg/dL (ref 0–99)
TRIGLYCERIDES: 45 mg/dL (ref 0.0–149.0)
Total CHOL/HDL Ratio: 3
VLDL: 9 mg/dL (ref 0.0–40.0)

## 2014-02-01 LAB — TSH: TSH: 1.11 u[IU]/mL (ref 0.35–5.50)

## 2014-02-01 LAB — MAGNESIUM: MAGNESIUM: 2 mg/dL (ref 1.5–2.5)

## 2014-02-01 NOTE — Progress Notes (Signed)
Patient ID: Michael Parker, male   DOB: August 23, 1966, 48 y.o.   MRN: [id number] [employer] 30 day cardiac event monitor applied to patient.

## 2014-02-06 DIAGNOSIS — I491 Atrial premature depolarization: Secondary | ICD-10-CM

## 2014-02-06 HISTORY — DX: Atrial premature depolarization: I49.1

## 2014-02-14 ENCOUNTER — Other Ambulatory Visit (HOSPITAL_COMMUNITY): Payer: BC Managed Care – PPO

## 2014-02-14 ENCOUNTER — Ambulatory Visit (HOSPITAL_COMMUNITY): Payer: BC Managed Care – PPO | Attending: Cardiology | Admitting: Radiology

## 2014-02-14 DIAGNOSIS — I10 Essential (primary) hypertension: Secondary | ICD-10-CM

## 2014-02-14 DIAGNOSIS — R002 Palpitations: Secondary | ICD-10-CM | POA: Insufficient documentation

## 2014-02-14 DIAGNOSIS — Z8249 Family history of ischemic heart disease and other diseases of the circulatory system: Secondary | ICD-10-CM | POA: Insufficient documentation

## 2014-02-14 NOTE — Progress Notes (Signed)
Echocardiogram performed.  

## 2014-02-23 ENCOUNTER — Ambulatory Visit (INDEPENDENT_AMBULATORY_CARE_PROVIDER_SITE_OTHER): Payer: BC Managed Care – PPO | Admitting: Physician Assistant

## 2014-02-23 DIAGNOSIS — Z8249 Family history of ischemic heart disease and other diseases of the circulatory system: Secondary | ICD-10-CM

## 2014-02-23 DIAGNOSIS — R002 Palpitations: Secondary | ICD-10-CM

## 2014-02-23 DIAGNOSIS — I10 Essential (primary) hypertension: Secondary | ICD-10-CM

## 2014-02-23 NOTE — Progress Notes (Signed)
Exercise Treadmill Test  Pre-Exercise Testing Evaluation Rhythm: sinus tachycardia  Rate: 96 bpm     Test  Exercise Tolerance Test Ordering MD: Thompson Grayer, MD  Interpreting MD: Bonnell Public, PA  Unique Test No: 1  Treadmill:  1  Indication for ETT: family hx  Contraindication to ETT: No   Stress Modality: exercise - treadmill  Cardiac Imaging Performed: non   Protocol: standard Bruce - maximal  Max BP:  201/79  Max MPHR (bpm):  175 85% MPR (bpm):  149  MPHR obtained (bpm):  169 % MPHR obtained:  96  Reached 85% MPHR (min:sec):  7:38 Total Exercise Time (min-sec):  10:00  Workload in METS:  11.7 Borg Scale: 16  Reason ETT Terminated:  fatigue    ST Segment Analysis At Rest: normal ST segments - no evidence of significant ST depression With Exercise: no evidence of significant ST depression  Other Information Arrhythmia:  No Angina during ETT:  absent (0) Quality of ETT:  diagnostic  ETT Interpretation:  normal - no evidence of ischemia by ST analysis  Comments: Good exercise tolerance. No chest pain or EKG changes.  Recommendations: Follow up with Dr. Rayann Heman

## 2014-03-08 ENCOUNTER — Other Ambulatory Visit: Payer: Self-pay

## 2014-03-09 ENCOUNTER — Ambulatory Visit (INDEPENDENT_AMBULATORY_CARE_PROVIDER_SITE_OTHER): Payer: BC Managed Care – PPO | Admitting: Internal Medicine

## 2014-03-09 ENCOUNTER — Encounter: Payer: Self-pay | Admitting: Internal Medicine

## 2014-03-09 VITALS — BP 140/82 | HR 89 | Ht 73.5 in | Wt 203.8 lb

## 2014-03-09 DIAGNOSIS — R002 Palpitations: Secondary | ICD-10-CM

## 2014-03-09 NOTE — Patient Instructions (Signed)
Your physician recommends that you schedule a follow-up appointment as needed  

## 2014-03-09 NOTE — Progress Notes (Signed)
PCP: Laurey Morale, MD  Michael Parker is a 48 y.o. male who presents today for routine cardiology followup.  Since last being seen in our clinic, the patient reports doing very well.  Today, he denies symptoms of palpitations, chest pain, shortness of breath,  lower extremity edema, dizziness, presyncope, or syncope.  The patient is otherwise without complaint today.   Past Medical History  Diagnosis Date  . Hypertension   . Chickenpox   . Obesity   . Hyperlipemia   . Irritable bowel syndrome (IBS)     sees Dr. Laurence Spates  . Herniated disc   . Premature atrial contractions 02/2014    documented on event monitor for palpitations    Past Surgical History  Procedure Laterality Date  . None      Current Outpatient Prescriptions  Medication Sig Dispense Refill  . amLODipine (NORVASC) 10 MG tablet Take 1 tablet (10 mg total) by mouth daily.  90 tablet  3  . dicyclomine (BENTYL) 20 MG tablet Take 20 mg by mouth 2 (two) times daily as needed.      . nystatin (MYCOSTATIN) 100000 UNIT/ML suspension Take 5 mLs (500,000 Units total) by mouth every 4 (four) hours as needed. Swish  200 mL  1   No current facility-administered medications for this visit.    Physical Exam: Filed Vitals:   03/09/14 1227  BP: 140/82  Pulse: 89  Height: 6' 1.5" (1.867 m)  Weight: 203 lb 12.8 oz (92.443 kg)    GEN- The patient is well appearing, alert and oriented x 3 today.   Head- normocephalic, atraumatic Eyes-  Sclera clear, conjunctiva pink Ears- hearing intact Oropharynx- clear Lungs- Clear to ausculation bilaterally, normal work of breathing Heart- Regular rate and rhythm, no murmurs, rubs or gallops, PMI not laterally displaced GI- soft, NT, ND, + BS Extremities- no clubbing, cyanosis, or edema Event monitor, labs, echo, and gxt discussion with the patient today  Assessment and Plan:  1. Pacs Palpitations on event monitor are due to pacs Patient is reassured Lifestyle modification  discussed Regular exercise is encouraged No further workup planned  Return as needed

## 2014-03-29 ENCOUNTER — Ambulatory Visit (INDEPENDENT_AMBULATORY_CARE_PROVIDER_SITE_OTHER): Payer: BC Managed Care – PPO | Admitting: Physician Assistant

## 2014-03-29 ENCOUNTER — Encounter: Payer: Self-pay | Admitting: Physician Assistant

## 2014-03-29 VITALS — BP 152/70 | HR 82 | Temp 98.3°F | Resp 16 | Ht 73.5 in | Wt 208.0 lb

## 2014-03-29 DIAGNOSIS — H6090 Unspecified otitis externa, unspecified ear: Secondary | ICD-10-CM

## 2014-03-29 DIAGNOSIS — H60399 Other infective otitis externa, unspecified ear: Secondary | ICD-10-CM

## 2014-03-29 DIAGNOSIS — J302 Other seasonal allergic rhinitis: Secondary | ICD-10-CM

## 2014-03-29 DIAGNOSIS — H6120 Impacted cerumen, unspecified ear: Secondary | ICD-10-CM

## 2014-03-29 DIAGNOSIS — J309 Allergic rhinitis, unspecified: Secondary | ICD-10-CM

## 2014-03-29 MED ORDER — HYDROCORTISONE-ACETIC ACID 1-2 % OT SOLN: 5.0000 [drp] | Freq: Three times a day (TID) | OTIC | Status: DC

## 2014-03-29 NOTE — Patient Instructions (Addendum)
Vosol HC ear drops 5 drops per ear up to 3 times a day.  Over-the-counter earwax softening drops to try and alleviate symptoms early in the future before they become as severe as this visit.  Plain OTC Mucinex (NOT D) for thick secretions ;force NON dairy fluids .    OTC Flonase OR Nasacort AQ 1 spray in each nostril twice a day as needed. Use the "crossover" technique into opposite nostril spraying toward opposite ear @ 45 degree angle, not straight up into nostril.   Plain OTC Allegra (NOT D )  160 daily , OTC Loratidine 10 mg , OR OTC Zyrtec 10 mg @ bedtime  as needed for itchy eyes & sneezing.  Followup if symptoms worsen or do not improve despite treatment.  Cerumen Impaction The structures of the external ear canal secrete a waxy substance known as cerumen. Excess cerumen can build up in the ear canal, causing a condition known as cerumen impaction. Cerumen impaction can cause ear pain as well as disrupt the function of the ear. The rate of cerumen production differs for each individual. For certain individuals, the configuration of one's ear canal may cause him or her to have a decreased ability to naturally remove cerumen. It is important to note that removing cerumen as a part of normal hygiene is not necessary, and the use of swabs in the ear canal is not recommended. SYMPTOMS   Diminished hearing.  Ear drainage.  Ear pain.  Ear itch. CAUSES  Excessive cerumen production.  RISK INCREASES WITH:  Frequent use of swabs to clean ears.  Narrow ear canals.  Eczema (a skin condition).  Dehydration. PREVENTION  Do Not insert objects into the ear, even with the intent of cleaning the ear.  Maintain hydration.  Control eczema if present. TREATMENT  Maintaining preventative measures is the best way to treat cerumen impaction. If symptoms of cerumen impaction develop the first step is to use over-the-counter or prescription ear drops that are intended to soften the cerumen.  If the cerumen does not clear, then visit your caregiver to have the cerumen removed. The most common method for cerumen removal is through irrigation with warm water. Although, some caregivers use ear curettes and other instruments to remove the cerumen physically. In the most severe cases, cerumen may be removed surgically. Document Released: 11/25/2005 Document Revised: 02/17/2012 Document Reviewed: 03/09/2009 [facility] Patient Information 2014 [place], [place].

## 2014-03-29 NOTE — Progress Notes (Signed)
Pre visit review using our clinic review tool, if applicable. No additional management support is needed unless otherwise documented below in the visit note. 

## 2014-03-29 NOTE — Progress Notes (Signed)
Subjective:    Patient ID: Michael Parker, male    DOB: [birth year], 48 y.o.   MRN: [id number]  HPI Patient is a 48 year old Caucasian male presenting to the clinic today for bilateral ear clogging. Patient states that he has had cerumen impaction in both ears several times in the past requiring irrigation. Patient states that he has had bilateral ear infections when he was younger, however today he does not feel as though he has any symptoms of ear infections. He today has a little bit of pain in his ear canal and hearing loss, but he believes it to be due to the serum and impaction. He is also complaining of some nasal allergy symptoms including rhinorrhea, postnasal drip, and nasal congestion. He denies fevers, chills, nausea, vomiting, diarrhea, shortness of breath, dizziness, and vertigo.   Review of Systems As per the history of present illness and are otherwise negative  Past Medical History  Diagnosis Date  . Hypertension   . Chickenpox   . Obesity   . Hyperlipemia   . Irritable bowel syndrome (IBS)     sees Dr. Laurence Spates  . Herniated disc   . Premature atrial contractions 02/2014    documented on event monitor for palpitations    Past Surgical History  Procedure Laterality Date  . None      reports that he has never smoked. He has never used smokeless tobacco. He reports that he does not drink alcohol or use illicit drugs. family history includes Aneurysm in his father; Depression in an other family member; Heart attack in his father; Hyperlipidemia in an other family member; Hypertension in an other family member; Sudden death (age of onset: 9) in his cousin. Allergies  Allergen Reactions  . Codeine     REACTION: hallucinations   No change in PFS history since last office visit on 12/22/13.      Objective:   Physical Exam  Nursing note and vitals reviewed. Constitutional: He is oriented to person, place, and time. He appears well-developed and well-nourished. No  distress.  HENT:  Head: Normocephalic and atraumatic.  Nose: Nose normal.  Mouth/Throat: No oropharyngeal exudate.  Oropharynx slightly erythematous with cobblestoning. No exudate.  Bilateral frontal and maxillary sinuses nontender to palpation.  Bilateral external ear canals acutely inflamed and painful on exam.  Hearing returned to normal bilaterally after lavage. Bilateral TMs appear normal.  Eyes: Conjunctivae and EOM are normal. Pupils are equal, round, and reactive to light.  Neck: Normal range of motion. Neck supple. No tracheal deviation present. No thyromegaly present.  Cardiovascular: Normal rate, regular rhythm, normal heart sounds and intact distal pulses.  Exam reveals no gallop and no friction rub.   No murmur heard. Pulmonary/Chest: Effort normal and breath sounds normal. No stridor. No respiratory distress. He has no wheezes. He has no rales. He exhibits no tenderness.  Abdominal: Soft. Bowel sounds are normal. There is no tenderness.  Musculoskeletal: Normal range of motion. He exhibits no edema.  Lymphadenopathy:    He has no cervical adenopathy.  Neurological: He is alert and oriented to person, place, and time. He has normal reflexes. No cranial nerve deficit. Coordination normal.  Skin: Skin is warm and dry. No rash noted. He is not diaphoretic. No erythema. No pallor.  Psychiatric: He has a normal mood and affect. His behavior is normal. Judgment and thought content normal.   Filed Vitals:   03/29/14 1126  BP: 152/70  Pulse: 82  Temp: 98.3 F (36.8 C)  Resp: 16    Lab Results  Component Value Date   WBC 6.7 01/29/2013   HGB 16.1 01/29/2013   HCT 48.0 01/29/2013   PLT 303.0 01/29/2013   GLUCOSE 103* 02/01/2014   CHOL 153 02/01/2014   TRIG 45.0 02/01/2014   HDL 53.10 02/01/2014   LDLCALC 91 02/01/2014   ALT 28 01/29/2013   AST 17 01/29/2013   NA 139 02/01/2014   K 4.0 02/01/2014   CL 103 02/01/2014   CREATININE 1.0 02/01/2014   BUN 17 02/01/2014   CO2 29  02/01/2014   TSH 1.11 02/01/2014   HGBA1C 6.0 10/21/2008       Assessment & Plan:  Michael Parker was seen today for cerumen impaction and allergies.  Diagnoses and associated orders for this visit:  Cerumen impaction - Ear Lavage  Seasonal allergies  Otitis externa - acetic acid-hydrocortisone (VOSOL-HC) otic solution; Place 5 drops into both ears 3 (three) times daily.   After ear lavage bilateral ears, the above physical exam findings seem to represent an acute otitis externa. At this time otitis media is not suspected.   Patient instructed to have close followup to make sure symptoms resolve.  Patient Instructions  Vosol HC ear drops 5 drops per ear up to 3 times a day.  Over-the-counter earwax softening drops to try and alleviate symptoms early in the future before they become as severe as this visit.  Plain OTC Mucinex (NOT D) for thick secretions ;force NON dairy fluids .    OTC Flonase OR Nasacort AQ 1 spray in each nostril twice a day as needed. Use the "crossover" technique into opposite nostril spraying toward opposite ear @ 45 degree angle, not straight up into nostril.   Plain OTC Allegra (NOT D )  160 daily , OTC Loratidine 10 mg , OR OTC Zyrtec 10 mg @ bedtime  as needed for itchy eyes & sneezing.  Followup if symptoms worsen or do not improve despite treatment.

## 2014-06-08 ENCOUNTER — Encounter: Payer: Self-pay | Admitting: Family

## 2014-06-08 ENCOUNTER — Ambulatory Visit (INDEPENDENT_AMBULATORY_CARE_PROVIDER_SITE_OTHER): Payer: BC Managed Care – PPO | Admitting: Family

## 2014-06-08 VITALS — BP 130/92 | Temp 98.9°F | Wt 210.0 lb

## 2014-06-08 DIAGNOSIS — R609 Edema, unspecified: Secondary | ICD-10-CM

## 2014-06-08 DIAGNOSIS — I10 Essential (primary) hypertension: Secondary | ICD-10-CM

## 2014-06-08 LAB — COMPREHENSIVE METABOLIC PANEL
ALT: 29 U/L (ref 0–53)
AST: 20 U/L (ref 0–37)
Albumin: 4.3 g/dL (ref 3.5–5.2)
Alkaline Phosphatase: 60 U/L (ref 39–117)
BUN: 14 mg/dL (ref 6–23)
CO2: 30 meq/L (ref 19–32)
CREATININE: 1.1 mg/dL (ref 0.4–1.5)
Calcium: 9.3 mg/dL (ref 8.4–10.5)
Chloride: 101 mEq/L (ref 96–112)
GFR: 75.96 mL/min (ref >60.00)
Glucose, Bld: 90 mg/dL (ref 70–99)
Potassium: 3.9 mEq/L (ref 3.5–5.1)
Sodium: 138 mEq/L (ref 135–145)
Total Bilirubin: 0.6 mg/dL (ref 0.2–1.2)
Total Protein: 7.8 g/dL (ref 6.0–8.3)

## 2014-06-08 MED ORDER — LOSARTAN POTASSIUM-HCTZ 50-12.5 MG PO TABS: 1.0000 | ORAL_TABLET | Freq: Every day | ORAL | Status: DC

## 2014-06-08 NOTE — Progress Notes (Signed)
Pre visit review using our clinic review tool, if applicable. No additional management support is needed unless otherwise documented below in the visit note. 

## 2014-06-08 NOTE — Progress Notes (Signed)
Subjective:    Patient ID: Michael Parker, male    DOB: 06/02/1966, 48 y.o.   MRN: [id number]  HPI 48 yo caucasian male presents to the clinic today with complaints or bilateral peripheral edema.  He reports he noticed the symptoms last week.  He reports the edema is more to the left side than the right.  He denies any pain in feet or calf areas, warmth or redness at this time. He denies any exacerbation in his underlying numbness and tingling to the right toes.  He denies any SOB or nocturnal frequency in urination. He reports the symptoms are worsened in the evening hours after standing on his feet for prolonged periods of time.  He reports the symptoms disappear with elevation of his feet. He denies attempting any self medication at this time.  Review of Systems  Respiratory: Negative for chest tightness and shortness of breath.   Cardiovascular: Negative for chest pain and palpitations.   Past Medical History  Diagnosis Date  . Hypertension   . Chickenpox   . Obesity   . Hyperlipemia   . Irritable bowel syndrome (IBS)     sees Dr. Laurence Spates  . Herniated disc   . Premature atrial contractions 02/2014    documented on event monitor for palpitations     History   Social History  . Marital Status: Married    Spouse Name: N/A    Number of Children: N/A  . Years of Education: N/A   Occupational History  . Not on file.   Social History Main Topics  . Smoking status: Never Smoker   . Smokeless tobacco: Never Used  . Alcohol Use: No  . Drug Use: No  . Sexual Activity: Not on file   Other Topics Concern  . Not on file   Social History Narrative  . No narrative on file    Past Surgical History  Procedure Laterality Date  . None      Family History  Problem Relation Age of Onset  . Depression      family hx  . Hyperlipidemia      family hx  . Hypertension      family hx  . Sudden death Cousin 70  . Heart attack Father     multiple MIs  . Aneurysm Father      Allergies  Allergen Reactions  . Ace Inhibitors Cough  . Codeine     REACTION: hallucinations  . Norvasc [Amlodipine Besylate] Swelling    Current Outpatient Prescriptions on File Prior to Visit  Medication Sig Dispense Refill  . dicyclomine (BENTYL) 20 MG tablet Take 20 mg by mouth 2 (two) times daily as needed.      Marland Kitchen acetic acid-hydrocortisone (VOSOL-HC) otic solution Place 5 drops into both ears 3 (three) times daily.  10 mL  0  . nystatin (MYCOSTATIN) 100000 UNIT/ML suspension Take 5 mLs (500,000 Units total) by mouth every 4 (four) hours as needed. Swish  200 mL  1   No current facility-administered medications on file prior to visit.    BP 130/92  Temp(Src) 98.9 F (37.2 C) (Oral)  Wt 210 lb (95.255 kg)chart    Objective:   Physical Exam  Constitutional: He appears well-developed and well-nourished.  Cardiovascular: Normal rate, regular rhythm, S1 normal, S2 normal and normal pulses.   Capillary refill brisk to toes. Mild 1+ pitting edema noted to left foot and ankle. Homans sign negative. No JVD noted.  Pulmonary/Chest: Effort normal.  Neurological:  He is alert.  Skin: Skin is warm, dry and intact.  Hair distribution equal to lower extremities.          Assessment & Plan:   Rashawn was seen today for edema.  Diagnoses and associated orders for this visit:  Peripheral edema - CMP  Unspecified essential hypertension  Other Orders - losartan-hydrochlorothiazide (HYZAAR) 50-12.5 MG per tablet; Take 1 tablet by mouth daily.   Call the office with any questions or concerns. Recheck in 3 weeks and sooner as needed.

## 2014-06-08 NOTE — Patient Instructions (Addendum)
Peripheral Edema You have swelling in your legs (peripheral edema). This swelling is due to excess accumulation of salt and water in your body. Edema may be a sign of heart, kidney or liver disease, or a side effect of a medication. It may also be due to problems in the leg veins. Elevating your legs and using special support stockings may be very helpful, if the cause of the swelling is due to poor venous circulation. Avoid long periods of standing, whatever the cause. Treatment of edema depends on identifying the cause. Chips, pretzels, pickles and other salty foods should be avoided. Restricting salt in your diet is almost always needed. Water pills (diuretics) are often used to remove the excess salt and water from your body via urine. These medicines prevent the kidney from reabsorbing sodium. This increases urine flow. Diuretic treatment may also result in lowering of potassium levels in your body. Potassium supplements may be needed if you have to use diuretics daily. Daily weights can help you keep track of your progress in clearing your edema. You should call your caregiver for follow up care as recommended. SEEK IMMEDIATE MEDICAL CARE IF:   You have increased swelling, pain, redness, or heat in your legs.  You develop shortness of breath, especially when lying down.  You develop chest or abdominal pain, weakness, or fainting.  You have a fever. Document Released: 01/02/2005 Document Revised: 02/17/2012 Document Reviewed: 12/13/2009 [facility] Patient Information 2015 [place], [place]. This information is not intended to replace advice given to you by your health care provider. Make sure you discuss any questions you have with your health care provider.   Low-Sodium Eating Plan Sodium raises blood pressure and causes water to be held in the body. Getting less sodium from food will help lower your blood pressure, reduce any swelling, and protect your heart, liver, and kidneys. We get sodium by  adding salt (sodium chloride) to food. Most of our sodium comes from canned, boxed, and frozen foods. Restaurant foods, fast foods, and pizza are also very high in sodium. Even if you take medicine to lower your blood pressure or to reduce fluid in your body, getting less sodium from your food is important. WHAT IS MY PLAN? Most people should limit their sodium intake to 2,300 mg a day. Your health care provider recommends that you limit your sodium intake to __________ a day.  WHAT DO I NEED TO KNOW ABOUT THIS EATING PLAN? For the low-sodium eating plan, you will follow these general guidelines:  Choose foods with a % Daily Value for sodium of less than 5% (as listed on the food label).   Use salt-free seasonings or herbs instead of table salt or sea salt.   Check with your health care provider or pharmacist before using salt substitutes.   Eat fresh foods.  Eat more vegetables and fruits.  Limit canned vegetables. If you do use them, rinse them well to decrease the sodium.   Limit cheese to 1 oz (28 g) per day.   Eat lower-sodium products, often labeled as "lower sodium" or "no salt added."  Avoid foods that contain monosodium glutamate (MSG). MSG is sometimes added to [place] food and some canned foods.  Check food labels (Nutrition Facts labels) on foods to learn how much sodium is in one serving.  Eat more home-cooked food and less restaurant, buffet, and fast food.  When eating at a restaurant, ask that your food be prepared with less salt or none, if possible.  HOW  DO I READ FOOD LABELS FOR SODIUM INFORMATION? The Nutrition Facts label lists the amount of sodium in one serving of the food. If you eat more than one serving, you must multiply the listed amount of sodium by the number of servings. Food labels may also identify foods as:  Sodium free--Less than 5 mg in a serving.  Very low sodium--35 mg or less in a serving.  Low sodium--140 mg or less in a  serving.  Light in sodium--50% less sodium in a serving. For example, if a food that usually has 300 mg of sodium is changed to become light in sodium, it will have 150 mg of sodium.  Reduced sodium--25% less sodium in a serving. For example, if a food that usually has 400 mg of sodium is changed to reduced sodium, it will have 300 mg of sodium. WHAT FOODS CAN I EAT? Grains Low-sodium cereals, including oats, puffed wheat and rice, and shredded wheat cereals. Low-sodium crackers. Unsalted rice and pasta. Lower-sodium bread.  Vegetables Frozen or fresh vegetables. Low-sodium or reduced-sodium canned vegetables. Low-sodium or reduced-sodium tomato sauce and paste. Low-sodium or reduced-sodium tomato and vegetable juices.  Fruits Fresh, frozen, and canned fruit. Fruit juice.  Meat and Other Protein Products Low-sodium canned tuna and salmon. Fresh or frozen meat, poultry, seafood, and fish. Lamb. Unsalted nuts. Dried beans, peas, and lentils without added salt. Unsalted canned beans. Homemade soups without salt. Eggs.  Dairy Milk. Soy milk. Ricotta cheese. Low-sodium or reduced-sodium cheeses. Yogurt.  Condiments Fresh and dried herbs and spices. Salt-free seasonings. Onion and garlic powders. Low-sodium varieties of mustard and ketchup. Lemon juice.  Fats and Oils Reduced-sodium salad dressings. Unsalted butter.  Other Unsalted popcorn and pretzels.  The items listed above may not be a complete list of recommended foods or beverages. Contact your dietitian for more options. WHAT FOODS ARE NOT RECOMMENDED? Grains Instant hot cereals. Bread stuffing, pancake, and biscuit mixes. Croutons. Seasoned rice or pasta mixes. Noodle soup cups. Boxed or frozen macaroni and cheese. Self-rising flour. Regular salted crackers. Vegetables Regular canned vegetables. Regular canned tomato sauce and paste. Regular tomato and vegetable juices. Frozen vegetables in sauces. Salted french fries.  Olives. Angie Fava. Relishes. Sauerkraut. Salsa. Meat and Other Protein Products Salted, canned, smoked, spiced, or pickled meats, seafood, or fish. Bacon, ham, sausage, hot dogs, corned beef, chipped beef, and packaged luncheon meats. Salt pork. Jerky. Pickled herring. Anchovies, regular canned tuna, and sardines. Salted nuts. Dairy Processed cheese and cheese spreads. Cheese curds. Blue cheese and cottage cheese. Buttermilk.  Condiments Onion and garlic salt, seasoned salt, table salt, and sea salt. Canned and packaged gravies. Worcestershire sauce. Tartar sauce. Barbecue sauce. Teriyaki sauce. Soy sauce, including reduced sodium. Steak sauce. Fish sauce. Oyster sauce. Cocktail sauce. Horseradish. Regular ketchup and mustard. Meat flavorings and tenderizers. Bouillon cubes. Hot sauce. Tabasco sauce. Marinades. Taco seasonings. Relishes. Fats and Oils Regular salad dressings. Salted butter. Margarine. Ghee. Bacon fat.  Other Potato and tortilla chips. Corn chips and puffs. Salted popcorn and pretzels. Canned or dried soups. Pizza. Frozen entrees and pot pies.  The items listed above may not be a complete list of foods and beverages to avoid. Contact your dietitian for more information. Document Released: 05/17/2002 Document Revised: 11/30/2013 Document Reviewed: 09/29/2013 [facility] Patient Information 2015 [place], [place]. This information is not intended to replace advice given to you by your health care provider. Make sure you discuss any questions you have with your health care provider.

## 2014-06-09 ENCOUNTER — Telehealth: Payer: Self-pay | Admitting: Family Medicine

## 2014-06-09 NOTE — Telephone Encounter (Signed)
Relevant patient education assigned to patient using Emmi. ° °

## 2014-06-22 ENCOUNTER — Ambulatory Visit: Payer: BC Managed Care – PPO | Admitting: Family Medicine

## 2014-07-04 ENCOUNTER — Encounter: Payer: Self-pay | Admitting: Family

## 2014-07-04 ENCOUNTER — Ambulatory Visit (INDEPENDENT_AMBULATORY_CARE_PROVIDER_SITE_OTHER): Payer: BC Managed Care – PPO | Admitting: Family

## 2014-07-04 VITALS — BP 126/78 | HR 71 | Wt 209.0 lb

## 2014-07-04 DIAGNOSIS — H6123 Impacted cerumen, bilateral: Secondary | ICD-10-CM

## 2014-07-04 DIAGNOSIS — I10 Essential (primary) hypertension: Secondary | ICD-10-CM

## 2014-07-04 DIAGNOSIS — H6120 Impacted cerumen, unspecified ear: Secondary | ICD-10-CM

## 2014-07-04 DIAGNOSIS — R609 Edema, unspecified: Secondary | ICD-10-CM

## 2014-07-04 NOTE — Patient Instructions (Signed)

## 2014-07-04 NOTE — Progress Notes (Signed)
Pre visit review using our clinic review tool, if applicable. No additional management support is needed unless otherwise documented below in the visit note. 

## 2014-07-04 NOTE — Progress Notes (Signed)
Subjective:    Patient ID: Michael Parker, male    DOB: 1966-05-15, 48 y.o.   MRN: [id number]  HPI 48 year old white male, nonsmoker is in today for recheck of hypertension. We discontinued amlodipine his last office visit due to peripheral edema. His edema has completely resolved. He is tolerating Hyzaar well. Denies any concerns. Blood pressure is stable.  Has concerns of cerumen impaction.   Review of Systems  Constitutional: Negative.   Respiratory: Negative.   Cardiovascular: Negative.   Gastrointestinal: Negative.   Endocrine: Negative.   Genitourinary: Negative.   Musculoskeletal: Negative.   Skin: Negative.   Allergic/Immunologic: Negative.   Psychiatric/Behavioral: Negative.    Past Medical History  Diagnosis Date  . Hypertension   . Chickenpox   . Obesity   . Hyperlipemia   . Irritable bowel syndrome (IBS)     sees Dr. Laurence Spates  . Herniated disc   . Premature atrial contractions 02/2014    documented on event monitor for palpitations     History   Social History  . Marital Status: Married    Spouse Name: N/A    Number of Children: N/A  . Years of Education: N/A   Occupational History  . Not on file.   Social History Main Topics  . Smoking status: Never Smoker   . Smokeless tobacco: Never Used  . Alcohol Use: No  . Drug Use: No  . Sexual Activity: Not on file   Other Topics Concern  . Not on file   Social History Narrative  . No narrative on file    Past Surgical History  Procedure Laterality Date  . None      Family History  Problem Relation Age of Onset  . Depression      family hx  . Hyperlipidemia      family hx  . Hypertension      family hx  . Sudden death Cousin 52  . Heart attack Father     multiple MIs  . Aneurysm Father     Allergies  Allergen Reactions  . Ace Inhibitors Cough  . Codeine     REACTION: hallucinations  . Norvasc [Amlodipine Besylate] Swelling    Current Outpatient Prescriptions on File Prior  to Visit  Medication Sig Dispense Refill  . acetic acid-hydrocortisone (VOSOL-HC) otic solution Place 5 drops into both ears 3 (three) times daily.  10 mL  0  . dicyclomine (BENTYL) 20 MG tablet Take 20 mg by mouth 2 (two) times daily as needed.      Marland Kitchen losartan-hydrochlorothiazide (HYZAAR) 50-12.5 MG per tablet Take 1 tablet by mouth daily.  30 tablet  3  . nystatin (MYCOSTATIN) 100000 UNIT/ML suspension Take 5 mLs (500,000 Units total) by mouth every 4 (four) hours as needed. Swish  200 mL  1   No current facility-administered medications on file prior to visit.    BP 126/78  Pulse 71  Wt 209 lb (94.802 kg)chart    Objective:   Physical Exam  Constitutional: He is oriented to person, place, and time. He appears well-developed and well-nourished.  HENT:  Right Ear: External ear normal.  Left Ear: External ear normal.  Nose: Nose normal.  Mouth/Throat: Oropharynx is clear and moist.  Neck: Normal range of motion. Neck supple.  Cardiovascular: Normal rate, regular rhythm and normal heart sounds.   Pulmonary/Chest: Effort normal and breath sounds normal.  Abdominal: Soft.  Neurological: He is alert and oriented to person, place, and time.  Skin: Skin  is warm and dry.  Psychiatric: He has a normal mood and affect.          Assessment & Plan:  Michael Parker was seen today for follow-up.  Diagnoses and associated orders for this visit:  Unspecified essential hypertension  Cerumen impaction, bilateral  Peripheral edema    continue current medications. Encouraged healthy diet, exercise, low sodium. Recheck in 6 months with PCP or sooner as needed.

## 2014-07-04 NOTE — Progress Notes (Signed)
   Subjective:    Patient ID: Michael Parker, male    DOB: 1966/11/17, 48 y.o.   MRN: [id number]  HPI    Review of Systems     Objective:   Physical Exam        Assessment & Plan:   Informed consent was obtained and peroxide gel was inserted into the ears bilaterally using the lavage kit the ears were lavaged until clean.Inspection with a cerumen spoon removed residual wax. Patient tolerated the procedure well.

## 2014-07-04 NOTE — Addendum Note (Signed)
Addended byRoxy Cedar B on: 07/04/2014 10:31 AM   Modules accepted: Level of Service

## 2014-09-05 ENCOUNTER — Encounter: Payer: Self-pay | Admitting: Family Medicine

## 2014-09-05 ENCOUNTER — Ambulatory Visit (INDEPENDENT_AMBULATORY_CARE_PROVIDER_SITE_OTHER): Payer: BC Managed Care – PPO | Admitting: Family Medicine

## 2014-09-05 VITALS — BP 145/86 | HR 92 | Temp 99.1°F | Ht 73.5 in | Wt 208.0 lb

## 2014-09-05 DIAGNOSIS — K4090 Unilateral inguinal hernia, without obstruction or gangrene, not specified as recurrent: Secondary | ICD-10-CM

## 2014-09-05 DIAGNOSIS — Z23 Encounter for immunization: Secondary | ICD-10-CM

## 2014-09-05 NOTE — Progress Notes (Signed)
Pre visit review using our clinic review tool, if applicable. No additional management support is needed unless otherwise documented below in the visit note. 

## 2014-09-05 NOTE — Addendum Note (Signed)
Addended by: Aggie Hacker A on: 09/05/2014 11:11 AM   Modules accepted: Orders

## 2014-09-05 NOTE — Progress Notes (Signed)
   Subjective:    Patient ID: Michael Parker, male    DOB: 1966/04/17, 48 y.o.   MRN: [id number]  HPI Here to check a mildly painful lump in the right groin that came up last week. No recent trauma.    Review of Systems  Constitutional: Negative.   Gastrointestinal: Positive for abdominal pain. Negative for nausea, vomiting, diarrhea, constipation, blood in stool, abdominal distention, anal bleeding and rectal pain.  Genitourinary: Negative.        Objective:   Physical Exam  Constitutional: He appears well-developed and well-nourished.  Abdominal: Soft. Bowel sounds are normal. He exhibits no distension. There is no rebound and no guarding.  Small mildly tender non-reducible right direct inguinal hernia           Assessment & Plan:  Refer to Surgery

## 2014-10-02 ENCOUNTER — Other Ambulatory Visit: Payer: Self-pay | Admitting: Family

## 2014-11-17 ENCOUNTER — Encounter: Payer: Self-pay | Admitting: Family Medicine

## 2014-11-17 ENCOUNTER — Ambulatory Visit (INDEPENDENT_AMBULATORY_CARE_PROVIDER_SITE_OTHER): Payer: BC Managed Care – PPO | Admitting: Family Medicine

## 2014-11-17 VITALS — BP 141/90 | HR 84 | Temp 98.7°F | Ht 73.5 in | Wt 221.0 lb

## 2014-11-17 DIAGNOSIS — J0100 Acute maxillary sinusitis, unspecified: Secondary | ICD-10-CM

## 2014-11-17 MED ORDER — AMOXICILLIN 875 MG PO TABS: 875.0000 mg | ORAL_TABLET | Freq: Two times a day (BID) | ORAL | Status: DC

## 2014-11-17 NOTE — Progress Notes (Signed)
   Subjective:    Patient ID: Michael Parker, male    DOB: 1966-11-02, 48 y.o.   MRN: [id number]  HPI Here for 2 weeks of sinus pressure, ringing in the ears, PND, and a dry cough.    Review of Systems  Constitutional: Negative.   HENT: Positive for congestion, postnasal drip and sinus pressure. Negative for ear pain.   Eyes: Negative.   Respiratory: Positive for cough.        Objective:   Physical Exam  Constitutional: He appears well-developed and well-nourished.  HENT:  Right Ear: External ear normal.  Left Ear: External ear normal.  Nose: Nose normal.  Mouth/Throat: Oropharynx is clear and moist.  Eyes: Conjunctivae are normal.  Pulmonary/Chest: Effort normal and breath sounds normal.  Lymphadenopathy:    He has no cervical adenopathy.          Assessment & Plan:  Add Mucinex

## 2014-11-17 NOTE — Progress Notes (Signed)
Pre visit review using our clinic review tool, if applicable. No additional management support is needed unless otherwise documented below in the visit note. 

## 2014-11-28 ENCOUNTER — Emergency Department (HOSPITAL_BASED_OUTPATIENT_CLINIC_OR_DEPARTMENT_OTHER)
Admission: EM | Admit: 2014-11-28 | Discharge: 2014-11-28 | Disposition: A | Discharge status: Home / Self Care | Payer: BC Managed Care – PPO | Attending: Emergency Medicine | Admitting: Emergency Medicine

## 2014-11-28 ENCOUNTER — Emergency Department (HOSPITAL_BASED_OUTPATIENT_CLINIC_OR_DEPARTMENT_OTHER): Payer: BC Managed Care – PPO

## 2014-11-28 ENCOUNTER — Encounter (HOSPITAL_BASED_OUTPATIENT_CLINIC_OR_DEPARTMENT_OTHER): Payer: Self-pay | Admitting: *Deleted

## 2014-11-28 DIAGNOSIS — Z8739 Personal history of other diseases of the musculoskeletal system and connective tissue: Secondary | ICD-10-CM | POA: Insufficient documentation

## 2014-11-28 DIAGNOSIS — I10 Essential (primary) hypertension: Secondary | ICD-10-CM | POA: Insufficient documentation

## 2014-11-28 DIAGNOSIS — R05 Cough: Secondary | ICD-10-CM | POA: Diagnosis present

## 2014-11-28 DIAGNOSIS — Z8719 Personal history of other diseases of the digestive system: Secondary | ICD-10-CM | POA: Diagnosis not present

## 2014-11-28 DIAGNOSIS — E669 Obesity, unspecified: Secondary | ICD-10-CM | POA: Diagnosis not present

## 2014-11-28 DIAGNOSIS — R0789 Other chest pain: Secondary | ICD-10-CM | POA: Diagnosis not present

## 2014-11-28 DIAGNOSIS — Z8639 Personal history of other endocrine, nutritional and metabolic disease: Secondary | ICD-10-CM | POA: Diagnosis not present

## 2014-11-28 DIAGNOSIS — Z8619 Personal history of other infectious and parasitic diseases: Secondary | ICD-10-CM | POA: Diagnosis not present

## 2014-11-28 DIAGNOSIS — Z792 Long term (current) use of antibiotics: Secondary | ICD-10-CM | POA: Insufficient documentation

## 2014-11-28 LAB — CBC WITH DIFFERENTIAL/PLATELET
Basophils Absolute: 0.1 10*3/uL (ref 0.0–0.1)
Basophils Relative: 1 % (ref 0–1)
Eosinophils Absolute: 0.2 10*3/uL (ref 0.0–0.7)
Eosinophils Relative: 2 % (ref 0–5)
HCT: 45.1 % (ref 39.0–52.0)
HEMOGLOBIN: 15.3 g/dL (ref 13.0–17.0)
LYMPHS ABS: 1.4 10*3/uL (ref 0.7–4.0)
Lymphocytes Relative: 14 % (ref 12–46)
MCH: 30 pg (ref 26.0–34.0)
MCHC: 33.9 g/dL (ref 30.0–36.0)
MCV: 88.4 fL (ref 78.0–100.0)
MONOS PCT: 8 % (ref 3–12)
Monocytes Absolute: 0.8 10*3/uL (ref 0.1–1.0)
NEUTROS ABS: 7.6 10*3/uL (ref 1.7–7.7)
NEUTROS PCT: 75 % (ref 43–77)
Platelets: 285 10*3/uL (ref 150–400)
RBC: 5.1 MIL/uL (ref 4.22–5.81)
RDW: 12.8 % (ref 11.5–15.5)
WBC: 10 10*3/uL (ref 4.0–10.5)

## 2014-11-28 LAB — BASIC METABOLIC PANEL
ANION GAP: 14 (ref 5–15)
BUN: 18 mg/dL (ref 6–23)
CALCIUM: 8.9 mg/dL (ref 8.4–10.5)
CO2: 25 mEq/L (ref 19–32)
Chloride: 101 mEq/L (ref 96–112)
Creatinine, Ser: 1 mg/dL (ref 0.50–1.35)
GFR, EST NON AFRICAN AMERICAN: 89 mL/min — AB (ref >90)
GLUCOSE: 105 mg/dL — AB (ref 70–99)
POTASSIUM: 3.9 meq/L (ref 3.7–5.3)
Sodium: 140 mEq/L (ref 137–147)

## 2014-11-28 LAB — TROPONIN I

## 2014-11-28 MED ORDER — KETOROLAC TROMETHAMINE 30 MG/ML IJ SOLN
60.0000 mg | Freq: Once | INTRAMUSCULAR | Status: AC
  Administered 2014-11-28: 60 mg via INTRAMUSCULAR
  Filled 2014-11-28: qty 2

## 2014-11-28 NOTE — Discharge Instructions (Signed)
Ibuprofen 600 mg 3 times daily for the next 5 days.  Follow-up with your cardiologist if not improving in the next week, and return to the ER if your symptoms substantially worsen or change.   Chest Pain (Nonspecific) It is often hard to give a specific diagnosis for the cause of chest pain. There is always a chance that your pain could be related to something serious, such as a heart attack or a blood clot in the lungs. You need to follow up with your health care provider for further evaluation. CAUSES   Heartburn.  Pneumonia or bronchitis.  Anxiety or stress.  Inflammation around your heart (pericarditis) or lung (pleuritis or pleurisy).  A blood clot in the lung.  A collapsed lung (pneumothorax). It can develop suddenly on its own (spontaneous pneumothorax) or from trauma to the chest.  Shingles infection (herpes zoster virus). The chest wall is composed of bones, muscles, and cartilage. Any of these can be the source of the pain.  The bones can be bruised by injury.  The muscles or cartilage can be strained by coughing or overwork.  The cartilage can be affected by inflammation and become sore (costochondritis). DIAGNOSIS  Lab tests or other studies may be needed to find the cause of your pain. Your health care provider may have you take a test called an ambulatory electrocardiogram (ECG). An ECG records your heartbeat patterns over a 24-hour period. You may also have other tests, such as:  Transthoracic echocardiogram (TTE). During echocardiography, sound waves are used to evaluate how blood flows through your heart.  Transesophageal echocardiogram (TEE).  Cardiac monitoring. This allows your health care provider to monitor your heart rate and rhythm in real time.  Holter monitor. This is a portable device that records your heartbeat and can help diagnose heart arrhythmias. It allows your health care provider to track your heart activity for several days, if  needed.  Stress tests by exercise or by giving medicine that makes the heart beat faster. TREATMENT   Treatment depends on what may be causing your chest pain. Treatment may include:  Acid blockers for heartburn.  Anti-inflammatory medicine.  Pain medicine for inflammatory conditions.  Antibiotics if an infection is present.  You may be advised to change lifestyle habits. This includes stopping smoking and avoiding alcohol, caffeine, and chocolate.  You may be advised to keep your head raised (elevated) when sleeping. This reduces the chance of acid going backward from your stomach into your esophagus. Most of the time, nonspecific chest pain will improve within 2-3 days with rest and mild pain medicine.  HOME CARE INSTRUCTIONS   If antibiotics were prescribed, take them as directed. Finish them even if you start to feel better.  For the next few days, avoid physical activities that bring on chest pain. Continue physical activities as directed.  Do not use any tobacco products, including cigarettes, chewing tobacco, or electronic cigarettes.  Avoid drinking alcohol.  Only take medicine as directed by your health care provider.  Follow your health care provider's suggestions for further testing if your chest pain does not go away.  Keep any follow-up appointments you made. If you do not go to an appointment, you could develop lasting (chronic) problems with pain. If there is any problem keeping an appointment, call to reschedule. SEEK MEDICAL CARE IF:   Your chest pain does not go away, even after treatment.  You have a rash with blisters on your chest.  You have a fever. SEEK IMMEDIATE  MEDICAL CARE IF:   You have increased chest pain or pain that spreads to your arm, neck, jaw, back, or abdomen.  You have shortness of breath.  You have an increasing cough, or you cough up blood.  You have severe back or abdominal pain.  You feel nauseous or vomit.  You have severe  weakness.  You faint.  You have chills. This is an emergency. Do not wait to see if the pain will go away. Get medical help at once. Call your local emergency services (911 in [place] Do not drive yourself to the hospital. MAKE SURE YOU:   Understand these instructions.  Will watch your condition.  Will get help right away if you are not doing well or get worse. Document Released: 09/04/2005 Document Revised: 11/30/2013 Document Reviewed: 06/30/2008 [facility] Patient Information 2015 [place], [place]. This information is not intended to replace advice given to you by your health care provider. Make sure you discuss any questions you have with your health care provider.

## 2014-11-28 NOTE — ED Notes (Signed)
Cough and sinus infection recently treated with Amoxicillin. Now he is having soreness in his chest. Hx of cracked sternum.

## 2014-11-28 NOTE — ED Provider Notes (Signed)
CSN: [id number]     Arrival date & time 11/28/14  1244 History   First MD Initiated Contact with Patient 11/28/14 1307     Chief Complaint  Patient presents with  . Cough     (Consider location/radiation/quality/duration/timing/severity/associated sxs/prior Treatment) HPI Comments: Patient is a 48 year old male with history of hypertension, high cholesterol. He presents with complaints of discomfort in his chest. He tells me that he has had a cough over the past week and has been experiencing some tightness in his chest. He states that he jumped over a stream carrying his child and this seemed to aggravate this discomfort he describes as in his sternum. It is sharp in nature and worse with position and movement. He denies to me he is experiencing shortness of breath, exertional symptoms, cough, fever.  He does report having a similar episode while he was in college that he reports was due to a "cracked sternum". He was seen by cardiology approximately one year ago and tells me he underwent a stress test which was okay.  Patient is a 48 y.o. male presenting with chest pain. The history is provided by the patient.  Chest Pain Pain location:  Substernal area Pain quality: sharp   Pain radiates to:  Does not radiate Pain radiates to the back: no   Pain severity:  Moderate Duration:  1 week Timing:  Intermittent Progression:  Worsening Chronicity:  New Context: not breathing   Relieved by:  Nothing Worsened by:  Certain positions, coughing and movement Ineffective treatments:  None tried   Past Medical History  Diagnosis Date  . Hypertension   . Chickenpox   . Obesity   . Hyperlipemia   . Irritable bowel syndrome (IBS)     sees Dr. Laurence Spates  . Herniated disc   . Premature atrial contractions 02/2014    documented on event monitor for palpitations    Past Surgical History  Procedure Laterality Date  . None     Family History  Problem Relation Age of Onset  . Depression       family hx  . Hyperlipidemia      family hx  . Hypertension      family hx  . Sudden death Cousin 28  . Heart attack Father     multiple MIs  . Aneurysm Father    History  Substance Use Topics  . Smoking status: Never Smoker   . Smokeless tobacco: Never Used  . Alcohol Use: No    Review of Systems  Cardiovascular: Positive for chest pain.  All other systems reviewed and are negative.     Allergies  Ace inhibitors; Codeine; and Norvasc  Home Medications   Prior to Admission medications   Medication Sig Start Date End Date Taking? Authorizing Provider  amoxicillin (AMOXIL) 875 MG tablet Take 1 tablet (875 mg total) by mouth 2 (two) times daily. 11/17/14   Laurey Morale, MD  losartan-hydrochlorothiazide (HYZAAR) 50-12.5 MG per tablet TAKE 1 TABLET BY MOUTH EVERY DAY 10/03/14   Laurey Morale, MD   BP 158/87 mmHg  Pulse 83  Temp(Src) 98.2 F (36.8 C) (Oral)  Resp 18  Ht 6' 1.5" (1.867 m)  Wt 215 lb (97.523 kg)  BMI 27.98 kg/m2  SpO2 100% Physical Exam  Constitutional: He is oriented to person, place, and time. He appears well-developed and well-nourished. No distress.  HENT:  Head: Normocephalic and atraumatic.  Neck: Normal range of motion. Neck supple.  Cardiovascular: Normal rate, regular rhythm  and normal heart sounds.   No murmur heard. Pulmonary/Chest: Effort normal and breath sounds normal. No respiratory distress. He has no wheezes. He exhibits tenderness.  There is tenderness to palpation over the sternum. This reproduces his symptoms.  Abdominal: Soft. Bowel sounds are normal. He exhibits no distension. There is no tenderness.  Musculoskeletal: Normal range of motion. He exhibits no edema.  Lymphadenopathy:    He has no cervical adenopathy.  Neurological: He is alert and oriented to person, place, and time.  Skin: Skin is warm and dry. He is not diaphoretic.  Nursing note and vitals reviewed.   ED Course  Procedures (including critical care  time) Labs Review Labs Reviewed  BASIC METABOLIC PANEL  TROPONIN I  CBC WITH DIFFERENTIAL    Imaging Review No results found.   EKG Interpretation   Date/Time:  Monday November 28 2014 13:05:00 EST Ventricular Rate:  84 PR Interval:  154 QRS Duration: 84 QT Interval:  370 QTC Calculation: 437 R Axis:   62 Text Interpretation:  Normal sinus rhythm Normal ECG Confirmed by DELOS   MD, Sky Borboa ([id number]) on 11/28/2014 1:19:03 PM      MDM   Final diagnoses:  None    Patient presents with complaints of sharp pain in the center of his chest that sounds musculoskeletal in nature. Is worse with palpation, movement, and certain positions. His EKG and troponin are negative with ongoing pains intermittently for the past week. I feel as though he is appropriate for discharge. I will recommend anti-inflammatories and when necessary follow-up. He has had a stress test in the past year which was normal.    Veryl Speak, MD 11/28/14 [phone]

## 2014-11-29 ENCOUNTER — Ambulatory Visit: Payer: BC Managed Care – PPO | Admitting: Family Medicine

## 2014-12-23 ENCOUNTER — Other Ambulatory Visit: Payer: BC Managed Care – PPO

## 2014-12-27 ENCOUNTER — Emergency Department (HOSPITAL_COMMUNITY)
Admission: EM | Admit: 2014-12-27 | Discharge: 2014-12-27 | Disposition: A | Discharge status: Home / Self Care | Payer: BLUE CROSS/BLUE SHIELD | Attending: Emergency Medicine | Admitting: Emergency Medicine

## 2014-12-27 ENCOUNTER — Encounter (HOSPITAL_COMMUNITY): Payer: Self-pay | Admitting: Emergency Medicine

## 2014-12-27 ENCOUNTER — Other Ambulatory Visit: Payer: Self-pay

## 2014-12-27 DIAGNOSIS — R000 Tachycardia, unspecified: Secondary | ICD-10-CM | POA: Insufficient documentation

## 2014-12-27 DIAGNOSIS — I10 Essential (primary) hypertension: Secondary | ICD-10-CM | POA: Insufficient documentation

## 2014-12-27 DIAGNOSIS — E669 Obesity, unspecified: Secondary | ICD-10-CM | POA: Diagnosis not present

## 2014-12-27 DIAGNOSIS — Z8639 Personal history of other endocrine, nutritional and metabolic disease: Secondary | ICD-10-CM | POA: Diagnosis not present

## 2014-12-27 DIAGNOSIS — T380X5A Adverse effect of glucocorticoids and synthetic analogues, initial encounter: Secondary | ICD-10-CM | POA: Insufficient documentation

## 2014-12-27 DIAGNOSIS — T50905A Adverse effect of unspecified drugs, medicaments and biological substances, initial encounter: Secondary | ICD-10-CM

## 2014-12-27 DIAGNOSIS — Z79899 Other long term (current) drug therapy: Secondary | ICD-10-CM | POA: Diagnosis not present

## 2014-12-27 DIAGNOSIS — Z8719 Personal history of other diseases of the digestive system: Secondary | ICD-10-CM | POA: Insufficient documentation

## 2014-12-27 DIAGNOSIS — Z8619 Personal history of other infectious and parasitic diseases: Secondary | ICD-10-CM | POA: Diagnosis not present

## 2014-12-27 MED ORDER — METOPROLOL TARTRATE 1 MG/ML IV SOLN: 5.0000 mg | Freq: Once | INTRAVENOUS | Status: DC

## 2014-12-27 MED ORDER — SODIUM CHLORIDE 0.9 % IV BOLUS (SEPSIS)
1000.0000 mL | Freq: Once | INTRAVENOUS | Status: AC
  Administered 2014-12-27: 1000 mL via INTRAVENOUS

## 2014-12-27 NOTE — ED Provider Notes (Signed)
CSN: [id number]     Arrival date & time 12/27/14  0229 History   First MD Initiated Contact with Patient 12/27/14 0259     Chief Complaint  Patient presents with  . Tachycardia     (Consider location/radiation/quality/duration/timing/severity/associated sxs/prior Treatment) HPI  Patient reports he got his first steroid injection and is back today around 11 AM. He states a few hours later he started feeling anxious. He states tonight he couldn't fall asleep and was trying to fall asleep in his recliner. He states about 1:30 AM he finally when upstairs to his bed and when he laid down he could feel his heart pounding in his ears. He states he checked his neck and his heart was beating fast. His wife who is a OB nurse checked his heart rate and at one time thought it was 200, the next time she checked it it was 140. He denies chest pain, shortness of breath, dyspnea on exertion. He does state he has felt flushed all day and he felt hot earlier in the day. He states he has never had a steroid injection in the past however about 4 years ago he took steroids for 5 days and states he had a very hard time controlling his temper. He is supposed to start steroids later today.   PCP Dr Sarajane Jews  Past Medical History  Diagnosis Date  . Hypertension   . Chickenpox   . Obesity   . Hyperlipemia   . Irritable bowel syndrome (IBS)     sees Dr. Laurence Spates  . Herniated disc   . Premature atrial contractions 02/2014    documented on event monitor for palpitations    Past Surgical History  Procedure Laterality Date  . None     Family History  Problem Relation Age of Onset  . Depression      family hx  . Hyperlipidemia      family hx  . Hypertension      family hx  . Sudden death Cousin 3  . Heart attack Father     multiple MIs  . Aneurysm Father    History  Substance Use Topics  . Smoking status: Never Smoker   . Smokeless tobacco: Never Used  . Alcohol Use: No  lives at home Lives with  spouse Employed as youth pastor  Review of Systems  All other systems reviewed and are negative.     Allergies  Ace inhibitors; Codeine; and Norvasc  Home Medications   Prior to Admission medications   Medication Sig Start Date End Date Taking? Authorizing Provider  ibuprofen (ADVIL,MOTRIN) 200 MG tablet Take 400 mg by mouth every 6 (six) hours as needed for moderate pain.   Yes Historical Provider, MD  losartan-hydrochlorothiazide (HYZAAR) 50-12.5 MG per tablet TAKE 1 TABLET BY MOUTH EVERY DAY 10/03/14  Yes Laurey Morale, MD  amoxicillin (AMOXIL) 875 MG tablet Take 1 tablet (875 mg total) by mouth 2 (two) times daily. Patient not taking: Reported on 12/27/2014 11/17/14   Laurey Morale, MD   BP 157/78 mmHg  Pulse 128  Temp(Src) 98.6 F (37 C) (Oral)  SpO2 100%  Vital signs normal except for tachycardia  Physical Exam  Constitutional: He is oriented to person, place, and time. He appears well-developed and well-nourished.  Non-toxic appearance. He does not appear ill. No distress.  HENT:  Head: Normocephalic and atraumatic.  Right Ear: External ear normal.  Left Ear: External ear normal.  Nose: Nose normal. No mucosal edema or  rhinorrhea.  Mouth/Throat: Oropharynx is clear and moist and mucous membranes are normal. No dental abscesses or uvula swelling.  Eyes: Conjunctivae and EOM are normal. Pupils are equal, round, and reactive to light.  Neck: Normal range of motion and full passive range of motion without pain. Neck supple.  Cardiovascular: Regular rhythm and normal heart sounds.  Tachycardia present.  Exam reveals no gallop and no friction rub.   No murmur heard. Pulmonary/Chest: Effort normal and breath sounds normal. No respiratory distress. He has no wheezes. He has no rhonchi. He has no rales. He exhibits no tenderness and no crepitus.  Abdominal: Soft. Normal appearance and bowel sounds are normal. He exhibits no distension. There is no tenderness. There is no  rebound and no guarding.  Musculoskeletal: Normal range of motion. He exhibits no edema or tenderness.  Moves all extremities well.   Neurological: He is alert and oriented to person, place, and time. He has normal strength. No cranial nerve deficit.  Skin: Skin is warm, dry and intact. No rash noted. No erythema. No pallor.  Psychiatric: He has a normal mood and affect. His speech is normal and behavior is normal. His mood appears not anxious.  Nursing note and vitals reviewed.   ED Course  Procedures (including critical care time)  Medications  sodium chloride 0.9 % bolus 1,000 mL (0 mLs Intravenous Stopped 12/27/14 0417)   Patient was offered IV Lopressor. However during the course of our conversation his heart rate improved to the 106-108 range. He states he would like to wait for now.  04:10 HR 99-105. He is starting to get tired and thinks he will be able to sleep when he gets home. Feels ready to be discharged.   Labs Review Labs Reviewed - No data to display  Imaging Review No results found.   EKG Interpretation   Date/Time:  Tuesday December 27 2014 02:37:26 EST Ventricular Rate:  126 PR Interval:  158 QRS Duration: 73 QT Interval:  304 QTC Calculation: 440 R Axis:   78 Text Interpretation:  Sinus tachycardia Anteroseptal infarct, age  indeterminate Since last tracing rate faster (28 Nov 2014) Confirmed by  Salvador Bigbee  MD-I, Asia Dusenbury ([id number]) on 12/27/2014 2:40:27 AM      MDM  patient presents with side effects after getting a steroid injection of his back with feeling of anxiousness, insomnia, and finally tachycardia which is improving with IV fluids and time.    Final diagnoses:  Tachycardia  Medication side effect, initial encounter    Plan discharge  Rolland Porter, MD, Alanson Aly, MD 12/27/14 [phone]

## 2014-12-27 NOTE — ED Notes (Signed)
Patient states that he had a steroid shot in his back today, woke up tonight and HR was high/racing. Alert and oriented. HR 120s-140s

## 2014-12-27 NOTE — Discharge Instructions (Signed)
Your symptoms tonight most likely from your steroid injection you have earlier today. Your symptoms should continue to improve over the next couple of hours. Recheck if you feel worse such as chest pain, vomiting, dizziness or weakness like you are going to pass out.  Let your orthopedist know about your side effects you had tonight before your next injection.

## 2014-12-30 ENCOUNTER — Encounter: Payer: BC Managed Care – PPO | Admitting: Family Medicine

## 2015-01-04 ENCOUNTER — Ambulatory Visit: Payer: BC Managed Care – PPO | Admitting: Family

## 2015-01-30 ENCOUNTER — Encounter: Payer: Self-pay | Admitting: Family Medicine

## 2015-01-30 ENCOUNTER — Other Ambulatory Visit (INDEPENDENT_AMBULATORY_CARE_PROVIDER_SITE_OTHER): Payer: BLUE CROSS/BLUE SHIELD

## 2015-01-30 DIAGNOSIS — Z0000 Encounter for general adult medical examination without abnormal findings: Secondary | ICD-10-CM

## 2015-01-30 LAB — TSH: TSH: 1.11 u[IU]/mL (ref 0.35–4.50)

## 2015-01-30 LAB — POCT URINALYSIS DIP (MANUAL ENTRY)
BILIRUBIN UA: NEGATIVE
Blood, UA: NEGATIVE
Glucose, UA: NEGATIVE
Ketones, POC UA: NEGATIVE
Leukocytes, UA: NEGATIVE
NITRITE UA: NEGATIVE
Protein Ur, POC: NEGATIVE
Spec Grav, UA: 1.015
Urobilinogen, UA: 0.2
pH, UA: 7

## 2015-01-30 LAB — HEPATIC FUNCTION PANEL
ALK PHOS: 67 U/L (ref 39–117)
ALT: 46 U/L (ref 0–53)
AST: 21 U/L (ref 0–37)
Albumin: 4.1 g/dL (ref 3.5–5.2)
BILIRUBIN DIRECT: 0.1 mg/dL (ref 0.0–0.3)
BILIRUBIN TOTAL: 0.6 mg/dL (ref 0.2–1.2)
TOTAL PROTEIN: 7.4 g/dL (ref 6.0–8.3)

## 2015-01-30 LAB — BASIC METABOLIC PANEL
BUN: 17 mg/dL (ref 6–23)
CALCIUM: 9.5 mg/dL (ref 8.4–10.5)
CHLORIDE: 103 meq/L (ref 96–112)
CO2: 28 mEq/L (ref 19–32)
CREATININE: 1 mg/dL (ref 0.40–1.50)
GFR: 85.43 mL/min (ref >60.00)
GLUCOSE: 81 mg/dL (ref 70–99)
POTASSIUM: 4.2 meq/L (ref 3.5–5.1)
Sodium: 140 mEq/L (ref 135–145)

## 2015-01-30 LAB — CBC WITH DIFFERENTIAL/PLATELET
BASOS ABS: 0 10*3/uL (ref 0.0–0.1)
BASOS PCT: 0.5 % (ref 0.0–3.0)
Eosinophils Absolute: 0.1 10*3/uL (ref 0.0–0.7)
Eosinophils Relative: 1.5 % (ref 0.0–5.0)
HCT: 46.2 % (ref 39.0–52.0)
HEMOGLOBIN: 15.7 g/dL (ref 13.0–17.0)
LYMPHS ABS: 1.9 10*3/uL (ref 0.7–4.0)
LYMPHS PCT: 25.6 % (ref 12.0–46.0)
MCHC: 34 g/dL (ref 30.0–36.0)
MCV: 86.3 fl (ref 78.0–100.0)
MONOS PCT: 9.5 % (ref 3.0–12.0)
Monocytes Absolute: 0.7 10*3/uL (ref 0.1–1.0)
NEUTROS ABS: 4.6 10*3/uL (ref 1.4–7.7)
NEUTROS PCT: 62.9 % (ref 43.0–77.0)
Platelets: 330 10*3/uL (ref 150.0–400.0)
RBC: 5.35 Mil/uL (ref 4.22–5.81)
RDW: 13.3 % (ref 11.5–15.5)
WBC: 7.4 10*3/uL (ref 4.0–10.5)

## 2015-01-30 LAB — LIPID PANEL
Cholesterol: 148 mg/dL (ref 0–200)
HDL: 42.1 mg/dL (ref >39.00)
LDL CALC: 94 mg/dL (ref 0–99)
NONHDL: 105.9
TRIGLYCERIDES: 62 mg/dL (ref 0.0–149.0)
Total CHOL/HDL Ratio: 4
VLDL: 12.4 mg/dL (ref 0.0–40.0)

## 2015-02-06 ENCOUNTER — Ambulatory Visit (INDEPENDENT_AMBULATORY_CARE_PROVIDER_SITE_OTHER): Payer: BLUE CROSS/BLUE SHIELD | Admitting: Family Medicine

## 2015-02-06 ENCOUNTER — Encounter: Payer: Self-pay | Admitting: Family Medicine

## 2015-02-06 VITALS — BP 142/80 | HR 87 | Ht 72.25 in | Wt 218.0 lb

## 2015-02-06 DIAGNOSIS — Z0000 Encounter for general adult medical examination without abnormal findings: Secondary | ICD-10-CM

## 2015-02-06 DIAGNOSIS — H6123 Impacted cerumen, bilateral: Secondary | ICD-10-CM

## 2015-02-06 NOTE — Progress Notes (Signed)
Pre visit review using our clinic review tool, if applicable. No additional management support is needed unless otherwise documented below in the visit note. 

## 2015-02-06 NOTE — Progress Notes (Signed)
   Subjective:    Patient ID: Michael Parker, male    DOB: 17-Aug-1966, 49 y.o.   MRN: [id number]  HPI 49 yr old male for a cpx. He feels well. He recently went to the ER with some sinus tachycardia that resulted from an epidural steroid injection to the back. His back feels better and his heart rate settled down to normal after 24 hours.    Review of Systems  Constitutional: Negative.   HENT: Negative.   Eyes: Negative.   Respiratory: Negative.   Cardiovascular: Negative.   Gastrointestinal: Negative.   Genitourinary: Negative.   Musculoskeletal: Negative.   Skin: Negative.   Neurological: Negative.   Psychiatric/Behavioral: Negative.        Objective:   Physical Exam  Constitutional: He is oriented to person, place, and time. He appears well-developed and well-nourished. No distress.  HENT:  Head: Normocephalic and atraumatic.  Nose: Nose normal.  Mouth/Throat: Oropharynx is clear and moist. No oropharyngeal exudate.  Both ear canals blocked with cerumen  Eyes: Conjunctivae and EOM are normal. Pupils are equal, round, and reactive to light. Right eye exhibits no discharge. Left eye exhibits no discharge. No scleral icterus.  Neck: Neck supple. No JVD present. No tracheal deviation present. No thyromegaly present.  Cardiovascular: Normal rate, regular rhythm, normal heart sounds and intact distal pulses.  Exam reveals no gallop and no friction rub.   No murmur heard. Pulmonary/Chest: Effort normal and breath sounds normal. No respiratory distress. He has no wheezes. He has no rales. He exhibits no tenderness.  Abdominal: Soft. Bowel sounds are normal. He exhibits no distension and no mass. There is no tenderness. There is no rebound and no guarding.  Genitourinary: Rectum normal, prostate normal and penis normal. Guaiac negative stool. No penile tenderness.  Musculoskeletal: Normal range of motion. He exhibits no edema or tenderness.  Lymphadenopathy:    He has no cervical  adenopathy.  Neurological: He is alert and oriented to person, place, and time. He has normal reflexes. No cranial nerve deficit. He exhibits normal muscle tone. Coordination normal.  Skin: Skin is warm and dry. No rash noted. He is not diaphoretic. No erythema. No pallor.  Psychiatric: He has a normal mood and affect. His behavior is normal. Judgment and thought content normal.          Assessment & Plan:  Well exam. His ears were irrigated clear with water.

## 2015-04-04 ENCOUNTER — Other Ambulatory Visit: Payer: Self-pay | Admitting: Family Medicine

## 2015-05-19 ENCOUNTER — Ambulatory Visit (INDEPENDENT_AMBULATORY_CARE_PROVIDER_SITE_OTHER): Payer: BLUE CROSS/BLUE SHIELD | Admitting: Family Medicine

## 2015-05-19 ENCOUNTER — Encounter: Payer: Self-pay | Admitting: Family Medicine

## 2015-05-19 VITALS — BP 138/82 | HR 64 | Temp 97.8°F | Resp 16 | Ht 72.25 in | Wt 221.0 lb

## 2015-05-19 DIAGNOSIS — H6123 Impacted cerumen, bilateral: Secondary | ICD-10-CM | POA: Diagnosis not present

## 2015-05-19 DIAGNOSIS — H60392 Other infective otitis externa, left ear: Secondary | ICD-10-CM | POA: Diagnosis not present

## 2015-05-19 MED ORDER — HYDROCORTISONE-ACETIC ACID 1-2 % OT SOLN: 4.0000 [drp] | Freq: Four times a day (QID) | OTIC | Status: DC

## 2015-05-19 NOTE — Progress Notes (Signed)
   Subjective:    Patient ID: Michael Parker, male    DOB: September 20, 1966, 48 y.o.   MRN: [id number]  HPI Here for 3 days of intermittent left ear pain, dizziness and ringing in the ear. He is prone to wax buildups.    Review of Systems  Constitutional: Negative.   HENT: Positive for ear pain and hearing loss. Negative for congestion, ear discharge and sinus pressure.   Eyes: Negative.   Respiratory: Negative.        Objective:   Physical Exam  Constitutional: He is oriented to person, place, and time. He appears well-developed and well-nourished.  HENT:  Nose: Nose normal.  Mouth/Throat: Oropharynx is clear and moist.  Both ear canals are full of cerumen. The left external canal is also red and inflamed   Eyes: Conjunctivae are normal.  Neck: No thyromegaly present.  Lymphadenopathy:    He has no cervical adenopathy.  Neurological: He is alert and oriented to person, place, and time. No cranial nerve deficit.          Assessment & Plan:  The cerumen was removed from both ear canals with a curette. Treat the otitis with Vosol HC drops

## 2015-05-19 NOTE — Progress Notes (Signed)
Pre visit review using our clinic review tool, if applicable. No additional management support is needed unless otherwise documented below in the visit note. 

## 2015-05-29 ENCOUNTER — Encounter: Payer: Self-pay | Admitting: Family Medicine

## 2015-05-29 ENCOUNTER — Ambulatory Visit (INDEPENDENT_AMBULATORY_CARE_PROVIDER_SITE_OTHER): Payer: BLUE CROSS/BLUE SHIELD | Admitting: Family Medicine

## 2015-05-29 VITALS — BP 141/88 | HR 85 | Temp 98.6°F | Ht 72.25 in | Wt 222.0 lb

## 2015-05-29 DIAGNOSIS — H8109 Meniere's disease, unspecified ear: Secondary | ICD-10-CM

## 2015-05-29 NOTE — Progress Notes (Signed)
   Subjective:    Patient ID: Michael Parker, male    DOB: 08-14-1966, 49 y.o.   MRN: [id number]  HPI Here to follow up on left ear pain, intermittent vertigo, and ringing in the ears. He was here 10 days ago and had cerumen removed form the ears. He was found to have a left otitis externa and he was started on Vosol HC drops. These hepled at first but then the pain returned. In addition he has had loud ringing in the ears and he has been dizzy. Meclizine helps for awhile.    Review of Systems  Constitutional: Negative.   HENT: Positive for ear pain and hearing loss. Negative for congestion, nosebleeds, postnasal drip and sinus pressure.   Eyes: Negative.   Respiratory: Negative.   Neurological: Negative.        Objective:   Physical Exam  Constitutional: He is oriented to person, place, and time. He appears well-developed and well-nourished.  HENT:  Right Ear: External ear normal.  Left Ear: External ear normal.  Nose: Nose normal.  Mouth/Throat: Oropharynx is clear and moist.  Eyes: Conjunctivae are normal. Pupils are equal, round, and reactive to light.  Neck: No thyromegaly present.  Cardiovascular: Normal rate, regular rhythm, normal heart sounds and intact distal pulses.   Pulmonary/Chest: Effort normal and breath sounds normal.  Lymphadenopathy:    He has no cervical adenopathy.  Neurological: He is alert and oriented to person, place, and time.          Assessment & Plan:  His otitis externa has resolved, but he may have Meniere's disease. Refer to ENT to evaluate

## 2015-05-29 NOTE — Progress Notes (Signed)
Pre visit review using our clinic review tool, if applicable. No additional management support is needed unless otherwise documented below in the visit note. 

## 2015-06-05 ENCOUNTER — Encounter: Payer: Self-pay | Admitting: Internal Medicine

## 2015-06-05 ENCOUNTER — Ambulatory Visit (INDEPENDENT_AMBULATORY_CARE_PROVIDER_SITE_OTHER): Payer: BLUE CROSS/BLUE SHIELD | Admitting: Internal Medicine

## 2015-06-05 VITALS — BP 137/88 | HR 98 | Ht 73.5 in | Wt 225.6 lb

## 2015-06-05 DIAGNOSIS — I10 Essential (primary) hypertension: Secondary | ICD-10-CM

## 2015-06-05 DIAGNOSIS — R42 Dizziness and giddiness: Secondary | ICD-10-CM

## 2015-06-05 DIAGNOSIS — R002 Palpitations: Secondary | ICD-10-CM

## 2015-06-05 MED ORDER — LOSARTAN POTASSIUM 50 MG PO TABS: 50.0000 mg | ORAL_TABLET | Freq: Every day | ORAL | Status: DC

## 2015-06-05 NOTE — Patient Instructions (Signed)
Medication Instructions:  Your physician has recommended you make the following change in your medication:  1)  Stop Losartan/HCTZ 2) Start Losartan 50 mg daily   Labwork: None ordered  Testing/Procedures: None ordered  Follow-Up: Your physician recommends that you schedule a follow-up appointment as needed   Any Other Special Instructions Will Be Listed Below (If Applicable).

## 2015-06-05 NOTE — Progress Notes (Signed)
PCP: Laurey Morale, MD  Michael Parker is a 49 y.o. male who presents today for routine cardiology followup.  Since last being seen in our clinic, the patient reports doing very well.  His palpitations are much improved.  He has had occasional dizziness recently.  He describes symptoms as positional and vertiginous.  He has also had ear fullness and sinus symptoms.  He is followed by ENT.  Today, he denies symptoms of palpitations, chest pain, shortness of breath,  lower extremity edema,  presyncope, or syncope.  The patient is otherwise without complaint today.   Past Medical History  Diagnosis Date  . Hypertension   . Chickenpox   . Obesity   . Hyperlipemia   . Irritable bowel syndrome (IBS)     sees Dr. Laurence Spates  . Herniated disc   . Premature atrial contractions 02/2014    documented on event monitor for palpitations    Past Surgical History  Procedure Laterality Date  . None      Current Outpatient Prescriptions  Medication Sig Dispense Refill  . fluticasone (FLONASE) 50 MCG/ACT nasal spray Place 2 sprays into both nostrils at bedtime.  5  . meclizine (ANTIVERT) 25 MG tablet Take 25 mg by mouth 3 (three) times daily as needed for dizziness.    Marland Kitchen losartan (COZAAR) 50 MG tablet Take 1 tablet (50 mg total) by mouth daily. 90 tablet 3   No current facility-administered medications for this visit.    Physical Exam: Filed Vitals:   06/05/15 1656 06/05/15 1657 06/05/15 1658 06/05/15 1659  BP: 144/87 143/91 144/90 137/88  Pulse: 90 92 93 98  Height:      Weight:        GEN- The patient is well appearing, alert and oriented x 3 today.   Head- normocephalic, atraumatic Eyes-  Sclera clear, conjunctiva pink Ears- hearing intact Oropharynx- clear Lungs- Clear to ausculation bilaterally, normal work of breathing Heart- Regular rate and rhythm, no murmurs, rubs or gallops, PMI not laterally displaced GI- soft, NT, ND, + BS Extremities- no clubbing, cyanosis, or edema Event  monitor, labs, echo, and gxt discussion with the patient today  ekg today reveals sinus rhythm  Assessment and Plan:  1. Pacs Palpitations on event monitor are due to pacs Patient is reassured  2. htn Stable, though he has gained weight since his last visit Regular exercise is encouraged Given dizziness, will stop hctz and continue ARB.  May need to increase ARB dose if BP increases  Return as needed

## 2015-11-05 IMAGING — CR DG CHEST 2V
2 series · 2 of 2 positions shown · non-contrast
Comparison: None.

CLINICAL DATA: Cough and chest pain.

EXAM:
CHEST  2 VIEW

[w chest pa]
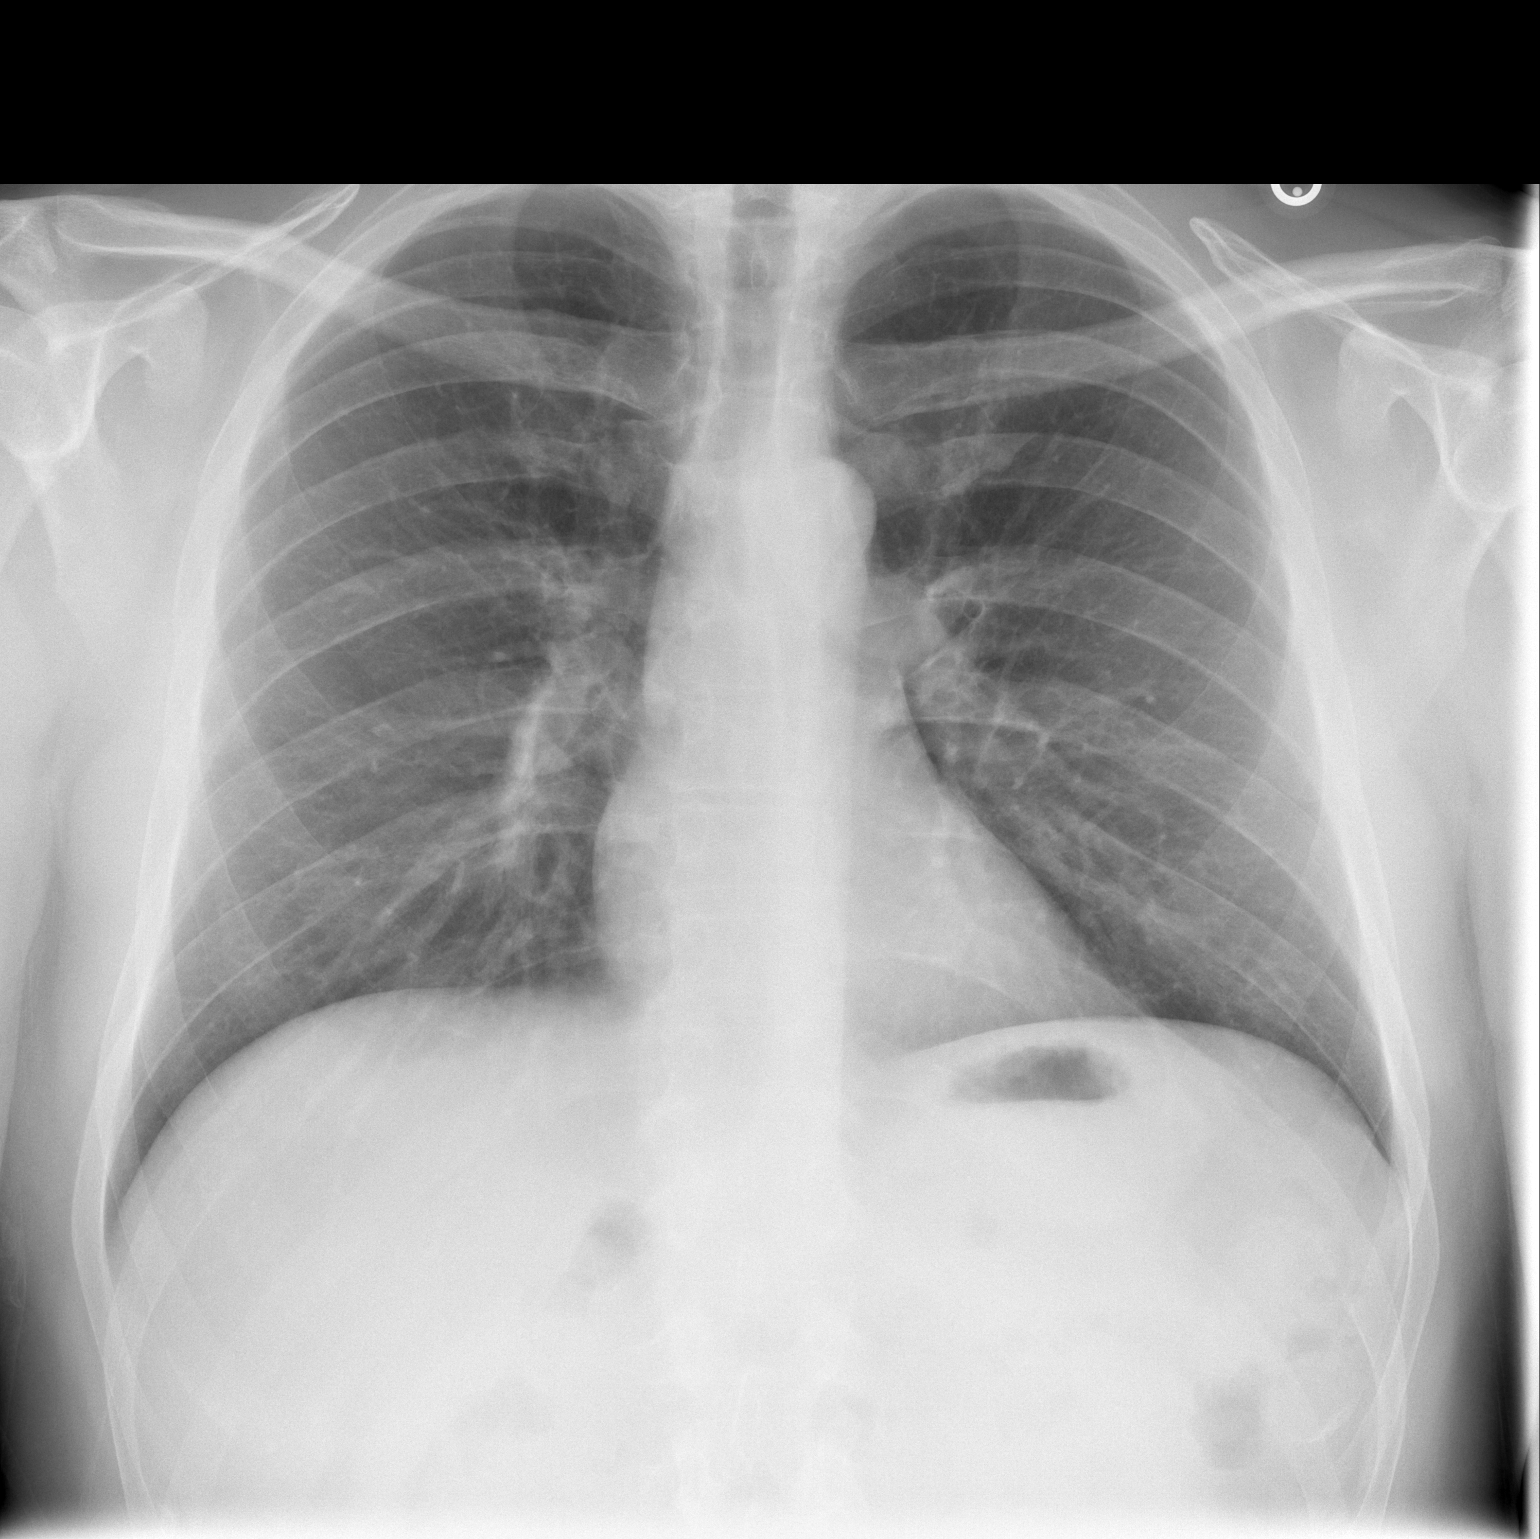

[w chest lat]
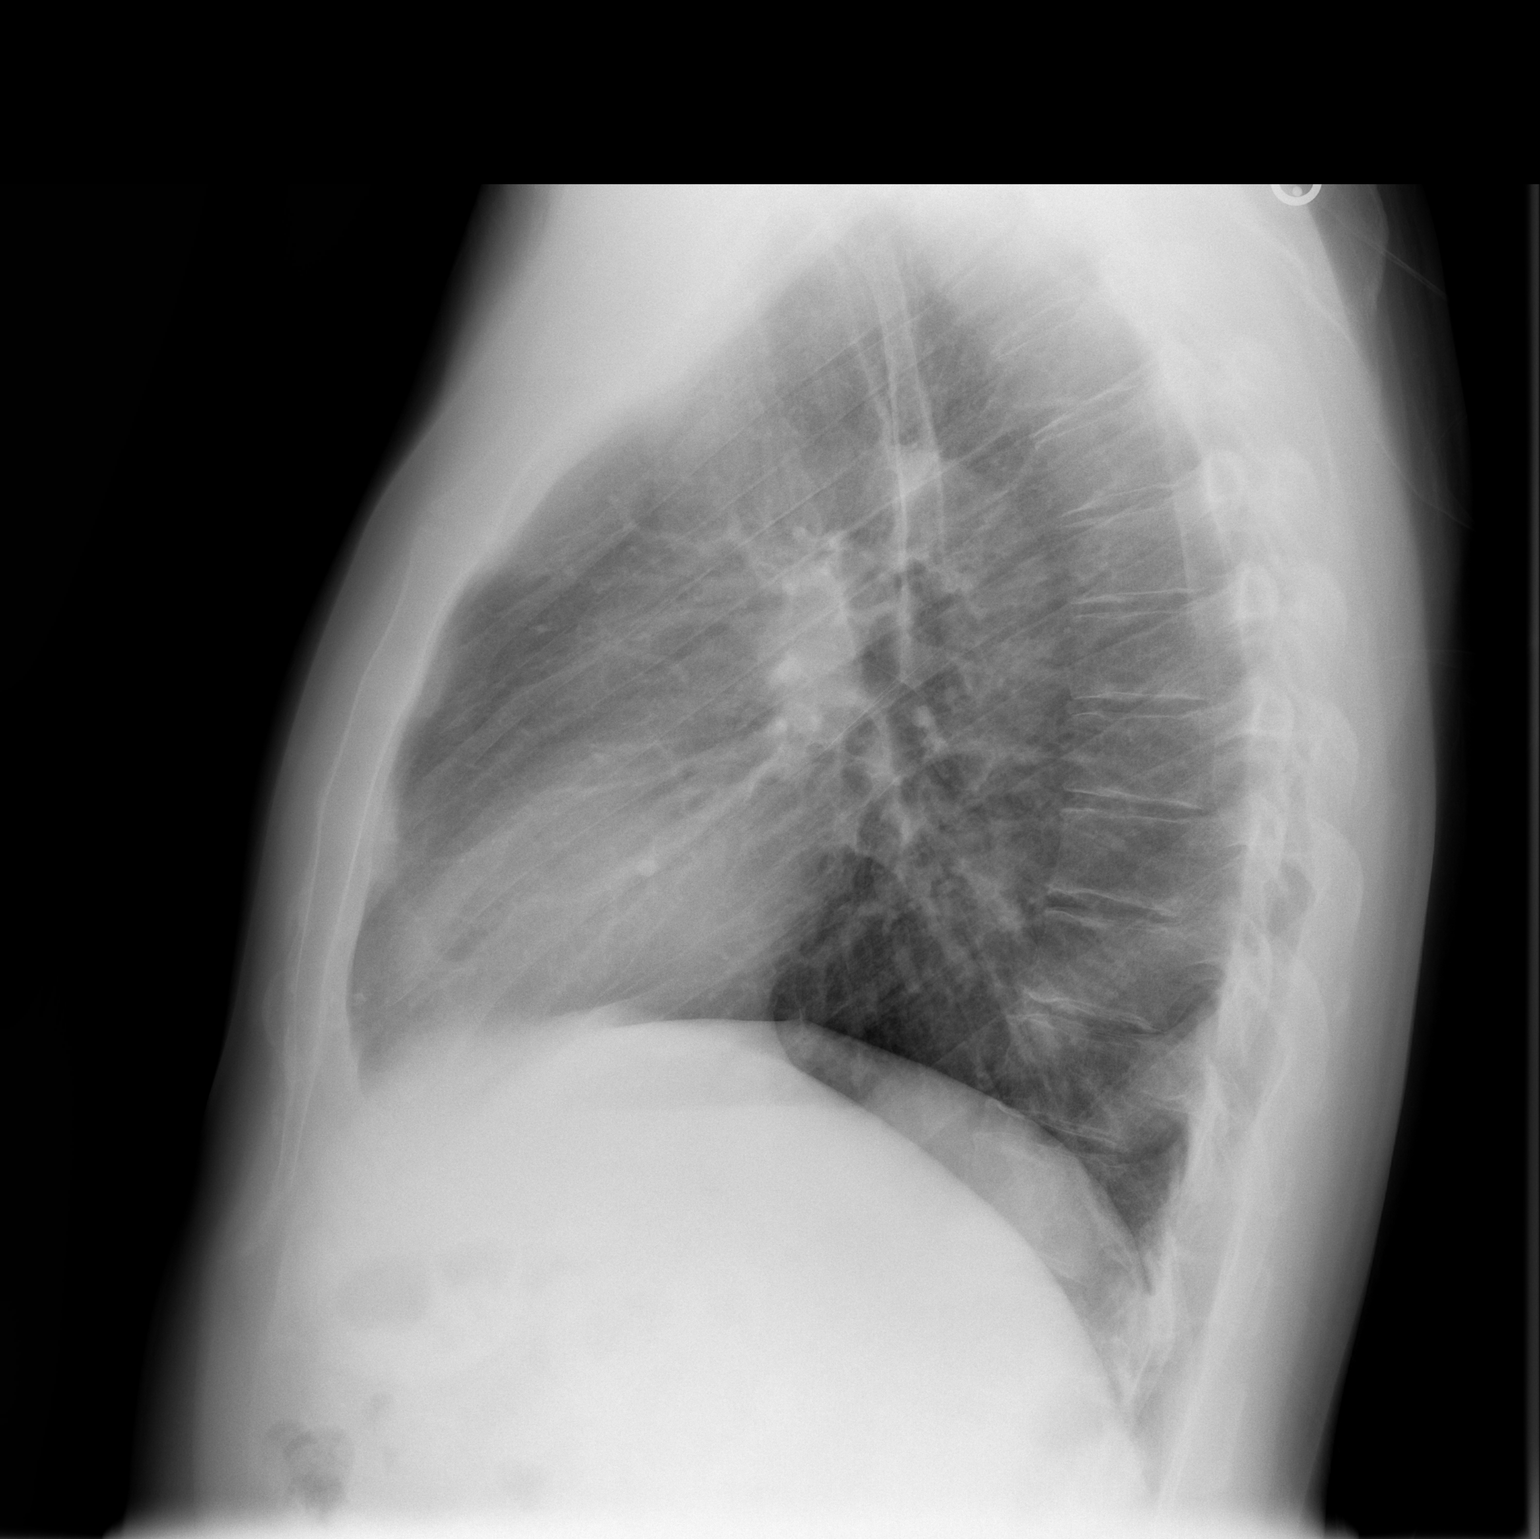

[2 of 2 positions shown; findings below may reference images not displayed]

FINDINGS: The cardiomediastinal contours are normal. The lungs are clear.
Pulmonary vasculature is normal. No consolidation, pleural effusion,
or pneumothorax. No acute osseous abnormalities are seen.
IMPRESSION: No acute pulmonary process.

## 2016-05-29 ENCOUNTER — Other Ambulatory Visit: Payer: Self-pay | Admitting: Internal Medicine

## 2016-06-25 ENCOUNTER — Other Ambulatory Visit: Payer: Self-pay | Admitting: Internal Medicine

## 2016-07-15 ENCOUNTER — Encounter: Payer: Self-pay | Admitting: Family Medicine

## 2016-07-15 ENCOUNTER — Ambulatory Visit (INDEPENDENT_AMBULATORY_CARE_PROVIDER_SITE_OTHER): Payer: BLUE CROSS/BLUE SHIELD | Admitting: Family Medicine

## 2016-07-15 VITALS — BP 160/94 | HR 84 | Temp 98.3°F | Ht 72.5 in | Wt 235.0 lb

## 2016-07-15 DIAGNOSIS — M7700 Medial epicondylitis, unspecified elbow: Secondary | ICD-10-CM

## 2016-07-15 DIAGNOSIS — M7710 Lateral epicondylitis, unspecified elbow: Secondary | ICD-10-CM

## 2016-07-15 DIAGNOSIS — D1720 Benign lipomatous neoplasm of skin and subcutaneous tissue of unspecified limb: Secondary | ICD-10-CM | POA: Diagnosis not present

## 2016-07-15 MED ORDER — FLUTICASONE PROPIONATE 50 MCG/ACT NA SUSP: 2.0000 | Freq: Every day | NASAL | 5 refills | Status: DC

## 2016-07-15 NOTE — Progress Notes (Signed)
   Subjective:    Patient ID: Michael Parker, male    DOB: 09-15-1966, 50 y.o.   MRN: [id number]  HPI Here for several weeks of intermittent pains in both elbows. He has been very active for the past month with cutting trees, replacing the deck behind his house, etc. He also noticed a lump near the right elbow he wants me to check.   Review of Systems  Constitutional: Negative.   Musculoskeletal: Positive for arthralgias and joint swelling.       Objective:   Physical Exam  Constitutional: He appears well-developed and well-nourished.  Musculoskeletal:  Very tender over the lateral and medial epicondyles of both elbows. No swelling, full ROM. He has a small mobile non-tender lump under the skin on the medial right upper arm          Assessment & Plan:  He has bilateral lateral and medial epicondylitis. He also has a lipoma on the right arm. He will simply watch the lipoma for now. He will rest the elbows as much as possible for a few weeks. Use ice and Ibuprofen prn. Laurey Morale, MD

## 2016-07-15 NOTE — Addendum Note (Signed)
Addended by: Alysia Penna A on: 07/15/2016 02:47 PM   Modules accepted: Orders

## 2016-07-15 NOTE — Progress Notes (Signed)
Pre visit review using our clinic review tool, if applicable. No additional management support is needed unless otherwise documented below in the visit note. 

## 2016-07-20 ENCOUNTER — Other Ambulatory Visit: Payer: Self-pay | Admitting: Internal Medicine

## 2016-07-22 NOTE — Telephone Encounter (Signed)
Should this be deferred to patients pcp as patient is prn follow up with Dr Rayann Heman?

## 2016-07-23 NOTE — Telephone Encounter (Signed)
Obtain from PCP 

## 2016-07-29 ENCOUNTER — Telehealth: Payer: Self-pay | Admitting: Family Medicine

## 2016-07-29 ENCOUNTER — Other Ambulatory Visit: Payer: Self-pay

## 2016-07-29 MED ORDER — LOSARTAN POTASSIUM 50 MG PO TABS: 50.0000 mg | ORAL_TABLET | Freq: Every day | ORAL | 0 refills | Status: DC

## 2016-07-29 NOTE — Telephone Encounter (Signed)
sent 

## 2016-07-29 NOTE — Telephone Encounter (Signed)
° °  Pt request refill of the following:     losartan (COZAAR) 50 MG tablet   Phamacy:  [facility] [place].  Pt said he was in to see the doctor last week and is asking if he need to still make a appointment before the med can be refill

## 2016-08-28 ENCOUNTER — Other Ambulatory Visit: Payer: Self-pay | Admitting: Family Medicine

## 2016-08-30 ENCOUNTER — Other Ambulatory Visit: Payer: Self-pay | Admitting: Family Medicine

## 2016-08-30 NOTE — Telephone Encounter (Signed)
Pt is out losartan 50 mg

## 2016-08-30 NOTE — Telephone Encounter (Signed)
This medication was sent in on 07/25/16 - and states that patient needs an appt for further refills.  Patient was last seen 07-15-16. Is this ok to refill?

## 2016-10-17 DIAGNOSIS — Z23 Encounter for immunization: Secondary | ICD-10-CM | POA: Diagnosis not present

## 2016-12-18 ENCOUNTER — Other Ambulatory Visit: Payer: Self-pay | Admitting: Family Medicine

## 2016-12-18 NOTE — Telephone Encounter (Signed)
Pt would like to have a refill on ear drop hydrocortisone-acetic acid otic 55ml bottle  Pharm:  [facility] in [place].

## 2016-12-19 MED ORDER — HYDROCORTISONE-ACETIC ACID 1-2 % OT SOLN: OTIC | 0 refills | Status: DC

## 2016-12-19 NOTE — Telephone Encounter (Signed)
Left message on voicemail Rx was sent to pharmacy as requested. Any questions please call office.

## 2016-12-19 NOTE — Telephone Encounter (Signed)
Please call this in for a 10 ml bottle with one rf. To appy 4 drops to the affected ear every 6 hours prn

## 2017-02-27 ENCOUNTER — Encounter: Payer: Self-pay | Admitting: Family Medicine

## 2017-02-27 ENCOUNTER — Ambulatory Visit (INDEPENDENT_AMBULATORY_CARE_PROVIDER_SITE_OTHER): Payer: BLUE CROSS/BLUE SHIELD | Admitting: Family Medicine

## 2017-02-27 VITALS — BP 142/76 | HR 98 | Temp 98.4°F | Ht 72.5 in | Wt 232.5 lb

## 2017-02-27 DIAGNOSIS — J209 Acute bronchitis, unspecified: Secondary | ICD-10-CM | POA: Diagnosis not present

## 2017-02-27 MED ORDER — AZITHROMYCIN 250 MG PO TABS: ORAL_TABLET | ORAL | 0 refills | Status: DC

## 2017-02-27 NOTE — Progress Notes (Signed)
   Subjective:    Patient ID: Michael Parker, male    DOB: 1966/10/15, 51 y.o.   MRN: [id number]  HPI Here for 3 days of PND, chest tightness and coughing up green sputum. No fever.   Review of Systems  Constitutional: Negative.   HENT: Positive for congestion and postnasal drip. Negative for sinus pain, sinus pressure and sore throat.   Eyes: Negative.   Respiratory: Positive for cough and chest tightness.        Objective:   Physical Exam  Constitutional: He appears well-developed and well-nourished.  HENT:  Right Ear: External ear normal.  Left Ear: External ear normal.  Nose: Nose normal.  Mouth/Throat: Oropharynx is clear and moist.  Eyes: Conjunctivae are normal.  Neck: No thyromegaly present.  Pulmonary/Chest: Effort normal. No respiratory distress. He has no wheezes. He has no rales.  Scattered rhonchi   Lymphadenopathy:    He has no cervical adenopathy.          Assessment & Plan:  Bronchitis, treat with a Zpack. Add Mucinex prn  Alysia Penna, MD

## 2017-05-17 DIAGNOSIS — T1591XA Foreign body on external eye, part unspecified, right eye, initial encounter: Secondary | ICD-10-CM | POA: Diagnosis not present

## 2017-08-21 ENCOUNTER — Other Ambulatory Visit: Payer: Self-pay | Admitting: Family Medicine

## 2017-08-24 ENCOUNTER — Other Ambulatory Visit: Payer: Self-pay | Admitting: Family Medicine

## 2017-08-28 ENCOUNTER — Encounter: Payer: Self-pay | Admitting: Family Medicine

## 2017-09-15 ENCOUNTER — Encounter: Payer: Self-pay | Admitting: Internal Medicine

## 2017-09-15 ENCOUNTER — Ambulatory Visit (INDEPENDENT_AMBULATORY_CARE_PROVIDER_SITE_OTHER): Payer: BLUE CROSS/BLUE SHIELD | Admitting: Internal Medicine

## 2017-09-15 VITALS — BP 122/84 | HR 72 | Ht 73.5 in | Wt 230.8 lb

## 2017-09-15 DIAGNOSIS — Z23 Encounter for immunization: Secondary | ICD-10-CM

## 2017-09-15 DIAGNOSIS — I10 Essential (primary) hypertension: Secondary | ICD-10-CM

## 2017-09-15 DIAGNOSIS — R0602 Shortness of breath: Secondary | ICD-10-CM

## 2017-09-15 MED ORDER — LOSARTAN POTASSIUM 50 MG PO TABS: 50.0000 mg | ORAL_TABLET | Freq: Every day | ORAL | 3 refills | Status: DC

## 2017-09-15 NOTE — Progress Notes (Signed)
   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 male who presents today for routine electrophysiology followup.  Since last being seen in our clinic, the patient reports doing very well.  We discussed work and family related stress at length.  He is not exercising or eating as he should but recognizes this and is trying to make change.  He finds that he has some SOB when playing basketball.  Today, he denies symptoms of palpitations, chest pain, lower extremity edema, dizziness, presyncope, or syncope.  The patient is otherwise without complaint today.   Past Medical History:  Diagnosis Date  . Chickenpox   . Herniated disc   . Hyperlipemia   . Hypertension   . Irritable bowel syndrome (IBS)    sees Dr. Laurence Spates  . Obesity   . Premature atrial contractions 02/2014   documented on event monitor for palpitations    Past Surgical History:  Procedure Laterality Date  . none      ROS- all systems are reviewed and negatives except as per HPI above  Current Outpatient Prescriptions  Medication Sig Dispense Refill  . cetirizine (ZYRTEC) 10 MG tablet Take 10 mg by mouth daily.    . fluticasone (FLONASE) 50 MCG/ACT nasal spray Place 2 sprays into both nostrils at bedtime as needed for allergies or rhinitis.    Marland Kitchen losartan (COZAAR) 50 MG tablet Take 1 tablet (50 mg total) by mouth daily. 90 tablet 3  . meclizine (ANTIVERT) 25 MG tablet Take 25 mg by mouth 3 (three) times daily as needed for dizziness.     No current facility-administered medications for this visit.     Physical Exam: Vitals:   09/15/17 1620  BP: 122/84  Pulse: 72  SpO2: 98%  Weight: 230 lb 12.8 oz (104.7 kg)  Height: 6' 1.5" (1.867 m)    GEN- The patient is well appearing, alert and oriented x 3 today.   Head- normocephalic, atraumatic Eyes-  Sclera clear, conjunctiva pink Ears- hearing intact Oropharynx- clear Lungs- Clear to ausculation bilaterally, normal work of  breathing Heart- Regular rate and rhythm, no murmurs, rubs or gallops, PMI not laterally displaced GI- soft, NT, ND, + BS Extremities- no clubbing, cyanosis, or edema  EKG tracing ordered today is personally reviewed and shows sinus rhythm 72 bpm, normal ekg  Assessment and Plan:  1. HTN Stable No change required today Regular exercise and stress avoidance encouraged bmet Fasting lipids A1C  2. PACs Stable No change required today  3. SOB Exercise treadmill ordered today bmet  Return as needed if stress test is low risk  Thompson Grayer MD, [facility] 09/15/2017 5:06 PM

## 2017-09-15 NOTE — Patient Instructions (Signed)
Medication Instructions:  Your physician recommends that you continue on your current medications as directed. Please refer to the Current Medication list given to you today.   Labwork: Your physician recommends that you return for lab work in: Fasting Lipid/A1C/BMP   Testing/Procedures:  Your physician has requested that you have an exercise tolerance test. For further information please visit [email or web address]. Please also follow instruction sheet, as given.    Follow-Up: Your physician recommends that you schedule a follow-up appointment as neded   Any Other Special Instructions Will Be Listed Below (If Applicable).     If you need a refill on your cardiac medications before your next appointment, please call your pharmacy.

## 2017-09-24 ENCOUNTER — Other Ambulatory Visit: Payer: Self-pay | Admitting: Internal Medicine

## 2017-09-24 ENCOUNTER — Ambulatory Visit (INDEPENDENT_AMBULATORY_CARE_PROVIDER_SITE_OTHER): Payer: BLUE CROSS/BLUE SHIELD

## 2017-09-24 ENCOUNTER — Other Ambulatory Visit: Payer: BLUE CROSS/BLUE SHIELD | Admitting: *Deleted

## 2017-09-24 DIAGNOSIS — R0602 Shortness of breath: Secondary | ICD-10-CM

## 2017-09-24 DIAGNOSIS — I10 Essential (primary) hypertension: Secondary | ICD-10-CM

## 2017-09-24 LAB — EXERCISE TOLERANCE TEST
CHL CUP RESTING HR STRESS: 75 {beats}/min
CSEPEDS: 0 s
CSEPEW: 11.6 METS
Exercise duration (min): 10 min
MPHR: 172 {beats}/min
Peak HR: 164 {beats}/min
Percent HR: 95 %
RPE: 17

## 2017-09-25 LAB — HEMOGLOBIN A1C
Est. average glucose Bld gHb Est-mCnc: 117 mg/dL
HEMOGLOBIN A1C: 5.7 % — AB (ref 4.8–5.6)

## 2017-09-25 LAB — LIPID PANEL
Chol/HDL Ratio: 3.2 ratio (ref 0.0–5.0)
Cholesterol, Total: 142 mg/dL (ref 100–199)
HDL: 45 mg/dL (ref >39)
LDL Calculated: 83 mg/dL (ref 0–99)
Triglycerides: 72 mg/dL (ref 0–149)
VLDL Cholesterol Cal: 14 mg/dL (ref 5–40)

## 2017-09-25 LAB — BASIC METABOLIC PANEL
BUN/Creatinine Ratio: 13 (ref 9–20)
BUN: 14 mg/dL (ref 6–24)
CO2: 23 mmol/L (ref 20–29)
Calcium: 9 mg/dL (ref 8.7–10.2)
Chloride: 102 mmol/L (ref 96–106)
Creatinine, Ser: 1.09 mg/dL (ref 0.76–1.27)
GFR calc Af Amer: 92 mL/min/{1.73_m2} (ref >59)
GFR calc non Af Amer: 80 mL/min/{1.73_m2} (ref >59)
GLUCOSE: 100 mg/dL — AB (ref 65–99)
Potassium: 4.9 mmol/L (ref 3.5–5.2)
SODIUM: 139 mmol/L (ref 134–144)

## 2017-11-10 DIAGNOSIS — J069 Acute upper respiratory infection, unspecified: Secondary | ICD-10-CM | POA: Diagnosis not present

## 2017-11-10 DIAGNOSIS — J0100 Acute maxillary sinusitis, unspecified: Secondary | ICD-10-CM | POA: Diagnosis not present

## 2017-12-09 DIAGNOSIS — H409 Unspecified glaucoma: Secondary | ICD-10-CM

## 2017-12-09 HISTORY — PX: GLAUCOMA SURGERY: SHX656

## 2017-12-09 HISTORY — DX: Unspecified glaucoma: H40.9

## 2017-12-12 DIAGNOSIS — H524 Presbyopia: Secondary | ICD-10-CM | POA: Diagnosis not present

## 2017-12-12 DIAGNOSIS — H52223 Regular astigmatism, bilateral: Secondary | ICD-10-CM | POA: Diagnosis not present

## 2017-12-12 DIAGNOSIS — H5210 Myopia, unspecified eye: Secondary | ICD-10-CM | POA: Diagnosis not present

## 2017-12-26 DIAGNOSIS — H40033 Anatomical narrow angle, bilateral: Secondary | ICD-10-CM | POA: Diagnosis not present

## 2017-12-31 DIAGNOSIS — H40032 Anatomical narrow angle, left eye: Secondary | ICD-10-CM | POA: Diagnosis not present

## 2018-01-07 DIAGNOSIS — H40032 Anatomical narrow angle, left eye: Secondary | ICD-10-CM | POA: Diagnosis not present

## 2018-02-06 ENCOUNTER — Ambulatory Visit: Payer: BLUE CROSS/BLUE SHIELD | Admitting: Family Medicine

## 2018-02-06 ENCOUNTER — Encounter: Payer: Self-pay | Admitting: Family Medicine

## 2018-02-06 VITALS — BP 124/82 | HR 92 | Temp 97.9°F | Ht 73.5 in | Wt 227.2 lb

## 2018-02-06 DIAGNOSIS — K219 Gastro-esophageal reflux disease without esophagitis: Secondary | ICD-10-CM

## 2018-02-06 MED ORDER — ESOMEPRAZOLE MAGNESIUM 40 MG PO CPDR: 40.0000 mg | DELAYED_RELEASE_CAPSULE | Freq: Every day | ORAL | 3 refills | Status: DC

## 2018-02-06 NOTE — Progress Notes (Signed)
   Subjective:    Patient ID: Michael Parker, male    DOB: October 18, 1966, 52 y.o.   MRN: [id number]  HPI Here for several days of heartburn and epigastric pains. He has a hx of GERD but he does not take any medication for it. He has been eating a lot of fatty and fried foods lately.    Review of Systems  Constitutional: Negative.   Respiratory: Negative.   Cardiovascular: Negative.   Gastrointestinal: Positive for abdominal pain. Negative for abdominal distention, anal bleeding, blood in stool, constipation, diarrhea, nausea, rectal pain and vomiting.       Objective:   Physical Exam  Constitutional: He appears well-developed and well-nourished.  Cardiovascular: Normal rate, regular rhythm, normal heart sounds and intact distal pulses.  Pulmonary/Chest: Effort normal and breath sounds normal. No respiratory distress. He has no wheezes. He has no rales.  Abdominal: Soft. Bowel sounds are normal. He exhibits no distension and no mass. There is no tenderness. There is no rebound and no guarding.          Assessment & Plan:  GERD, get back on Nexium 40 mg every day.  Alysia Penna, MD

## 2018-04-16 DIAGNOSIS — K219 Gastro-esophageal reflux disease without esophagitis: Secondary | ICD-10-CM | POA: Diagnosis not present

## 2018-04-16 DIAGNOSIS — K582 Mixed irritable bowel syndrome: Secondary | ICD-10-CM | POA: Diagnosis not present

## 2018-05-06 ENCOUNTER — Ambulatory Visit: Payer: BLUE CROSS/BLUE SHIELD | Admitting: Family Medicine

## 2018-05-06 ENCOUNTER — Encounter: Payer: Self-pay | Admitting: Family Medicine

## 2018-05-06 VITALS — BP 140/90 | HR 88 | Temp 98.5°F | Ht 73.5 in | Wt 223.6 lb

## 2018-05-06 DIAGNOSIS — R42 Dizziness and giddiness: Secondary | ICD-10-CM | POA: Diagnosis not present

## 2018-05-06 DIAGNOSIS — H6123 Impacted cerumen, bilateral: Secondary | ICD-10-CM | POA: Diagnosis not present

## 2018-05-06 MED ORDER — MECLIZINE HCL 25 MG PO TABS: 25.0000 mg | ORAL_TABLET | Freq: Three times a day (TID) | ORAL | 2 refills | Status: DC | PRN

## 2018-05-06 NOTE — Progress Notes (Signed)
   Subjective:    Patient ID: Nezar Buckles, male    DOB: Apr 22, 1966, 52 y.o.   MRN: [id number]  HPI Here for several days of intermittent dizziness, nausea without vomiting and head congestion. No headache or fever. He has a hx of vertigo and cerumen impactions.    Review of Systems  Constitutional: Negative.   HENT: Positive for sinus pressure. Negative for ear pain, hearing loss and postnasal drip.   Eyes: Negative.   Respiratory: Negative.   Neurological: Positive for dizziness. Negative for headaches.       Objective:   Physical Exam  Constitutional: He is oriented to person, place, and time. He appears well-developed and well-nourished.  HENT:  Left Ear: External ear normal.  Nose: Nose normal.  Mouth/Throat: Oropharynx is clear and moist.  Both ear canals have cerumen present   Eyes: Pupils are equal, round, and reactive to light. Conjunctivae and EOM are normal.  Neck: Neck supple. No thyromegaly present.  Pulmonary/Chest: Effort normal and breath sounds normal.  Lymphadenopathy:    He has no cervical adenopathy.  Neurological: He is alert and oriented to person, place, and time. He displays normal reflexes. No cranial nerve deficit. He exhibits normal muscle tone.          Assessment & Plan:  He has another bout of vertigo, and the cerumen impactions are playing a role. These are irrigated clear with water. Refilled meclizine to use prn.  Alysia Penna, MD

## 2018-09-14 DIAGNOSIS — B9689 Other specified bacterial agents as the cause of diseases classified elsewhere: Secondary | ICD-10-CM | POA: Diagnosis not present

## 2018-09-14 DIAGNOSIS — J208 Acute bronchitis due to other specified organisms: Secondary | ICD-10-CM | POA: Diagnosis not present

## 2018-09-15 ENCOUNTER — Other Ambulatory Visit: Payer: Self-pay | Admitting: Internal Medicine

## 2018-10-12 ENCOUNTER — Other Ambulatory Visit: Payer: Self-pay | Admitting: Internal Medicine

## 2018-10-29 ENCOUNTER — Other Ambulatory Visit: Payer: Self-pay | Admitting: Internal Medicine

## 2018-11-10 DIAGNOSIS — M549 Dorsalgia, unspecified: Secondary | ICD-10-CM | POA: Diagnosis not present

## 2018-11-10 DIAGNOSIS — M5136 Other intervertebral disc degeneration, lumbar region: Secondary | ICD-10-CM | POA: Diagnosis not present

## 2018-11-10 DIAGNOSIS — M47817 Spondylosis without myelopathy or radiculopathy, lumbosacral region: Secondary | ICD-10-CM | POA: Diagnosis not present

## 2018-11-18 ENCOUNTER — Telehealth: Payer: Self-pay | Admitting: Family Medicine

## 2018-11-18 NOTE — Telephone Encounter (Signed)
Copied from [place] [id number]. Topic: Quick Communication - Rx Refill/Question >> Nov 18, 2018  8:27 AM Scherrie Gerlach wrote: Medication: losartan (COZAAR) 50 MG tablet Pt would like Dr Sarajane Jews to take over this med for him.  Dr Sarajane Jews used to prescribe for him, but Dr Rayann Heman did at pt's last yearly OV.  Pt states he sees Dr Sarajane Jews more than he sees Dr Rayann Heman. Pt thought Dr Sarajane Jews was the prescribing MD. Pt states he only 2 pills left and would like to know something as soon as possible.  [facility] [place], [place] - [address] [phone] (Phone) [phone] (Fax)

## 2018-11-19 ENCOUNTER — Other Ambulatory Visit: Payer: Self-pay

## 2018-11-19 NOTE — Telephone Encounter (Signed)
Pt calling to check this.  Pt states that he took his last pill this morning.  Pt would like to know today if this will be able to be done or if he needs to try and get in with Dr. Rayann Heman.

## 2018-11-19 NOTE — Telephone Encounter (Signed)
See pt. Request for Dr. Sarajane Jews to refill pt.'s Cozaar.

## 2018-11-19 NOTE — Telephone Encounter (Signed)
Please advise on refill request as patient is out of medication but is PRN f/u with Dr Rayann Heman. See phone note from yesterdays conversation with pcp office. Thanks, MI

## 2018-11-19 NOTE — Telephone Encounter (Signed)
Patient called and advised to call Dr. Rayann Heman to schedule an annual physical for further refills as noted on the note to pharmacy on losartan in profile. Patient verbalized understanding. He says he is out and didn't know who to call. I advised that if Dr. Sarajane Jews refilled, it's a possibility it will take up to 3 days for refilling and since Dr. Rayann Heman has filled it last, he will need to contact that office. I advised if Dr. Rayann Heman says to have the primary to manage the losartan, then call us back and it will be sent to Dr. Sarajane Jews, he verbalized understanding.

## 2018-11-20 MED ORDER — LOSARTAN POTASSIUM 50 MG PO TABS: 50.0000 mg | ORAL_TABLET | Freq: Every day | ORAL | 3 refills | Status: DC

## 2018-11-20 NOTE — Telephone Encounter (Signed)
Pt has talked to Dr. Jackalyn Lombard office again and since he is a PRN pt for cardiology they have deferred to Dr. Sarajane Jews. Pt is asking for medication until appt scheduled on 12/18/2018. He ran out 12/12. Please call to notify at [phone].  [facility] STORE [place], [place] - [address] [phone] (Phone) [phone] (Fax)

## 2018-11-20 NOTE — Telephone Encounter (Signed)
Called and spoke with pt and he is aware of rx that has been sent to the pharmacy. Noting further is needed.

## 2018-11-20 NOTE — Addendum Note (Signed)
Addended by: Elie Confer on: 11/20/2018 11:28 AM   Modules accepted: Orders

## 2018-11-20 NOTE — Telephone Encounter (Signed)
Of course I will refill it. Call in #[phone] with 3 rf

## 2018-11-20 NOTE — Telephone Encounter (Signed)
Dr. Sarajane Jews please advise on refill of the losartan.  thanks

## 2018-11-24 DIAGNOSIS — G894 Chronic pain syndrome: Secondary | ICD-10-CM | POA: Diagnosis not present

## 2018-11-24 DIAGNOSIS — M6281 Muscle weakness (generalized): Secondary | ICD-10-CM | POA: Diagnosis not present

## 2018-11-24 DIAGNOSIS — M545 Low back pain: Secondary | ICD-10-CM | POA: Diagnosis not present

## 2018-11-24 DIAGNOSIS — H6090 Unspecified otitis externa, unspecified ear: Secondary | ICD-10-CM | POA: Diagnosis not present

## 2018-11-26 DIAGNOSIS — G894 Chronic pain syndrome: Secondary | ICD-10-CM | POA: Diagnosis not present

## 2018-11-26 DIAGNOSIS — M6281 Muscle weakness (generalized): Secondary | ICD-10-CM | POA: Diagnosis not present

## 2018-11-26 DIAGNOSIS — M545 Low back pain: Secondary | ICD-10-CM | POA: Diagnosis not present

## 2018-12-01 DIAGNOSIS — M545 Low back pain: Secondary | ICD-10-CM | POA: Diagnosis not present

## 2018-12-01 DIAGNOSIS — M6281 Muscle weakness (generalized): Secondary | ICD-10-CM | POA: Diagnosis not present

## 2018-12-01 DIAGNOSIS — G894 Chronic pain syndrome: Secondary | ICD-10-CM | POA: Diagnosis not present

## 2018-12-04 DIAGNOSIS — G894 Chronic pain syndrome: Secondary | ICD-10-CM | POA: Diagnosis not present

## 2018-12-04 DIAGNOSIS — M545 Low back pain: Secondary | ICD-10-CM | POA: Diagnosis not present

## 2018-12-04 DIAGNOSIS — M6281 Muscle weakness (generalized): Secondary | ICD-10-CM | POA: Diagnosis not present

## 2018-12-15 DIAGNOSIS — M545 Low back pain: Secondary | ICD-10-CM | POA: Diagnosis not present

## 2018-12-15 DIAGNOSIS — M6281 Muscle weakness (generalized): Secondary | ICD-10-CM | POA: Diagnosis not present

## 2018-12-15 DIAGNOSIS — G894 Chronic pain syndrome: Secondary | ICD-10-CM | POA: Diagnosis not present

## 2018-12-17 DIAGNOSIS — M545 Low back pain: Secondary | ICD-10-CM | POA: Diagnosis not present

## 2018-12-17 DIAGNOSIS — G894 Chronic pain syndrome: Secondary | ICD-10-CM | POA: Diagnosis not present

## 2018-12-17 DIAGNOSIS — M6281 Muscle weakness (generalized): Secondary | ICD-10-CM | POA: Diagnosis not present

## 2018-12-18 ENCOUNTER — Encounter: Payer: BLUE CROSS/BLUE SHIELD | Admitting: Family Medicine

## 2018-12-22 ENCOUNTER — Encounter: Payer: BLUE CROSS/BLUE SHIELD | Admitting: Family Medicine

## 2018-12-23 DIAGNOSIS — G894 Chronic pain syndrome: Secondary | ICD-10-CM | POA: Diagnosis not present

## 2018-12-23 DIAGNOSIS — M545 Low back pain: Secondary | ICD-10-CM | POA: Diagnosis not present

## 2018-12-23 DIAGNOSIS — M6281 Muscle weakness (generalized): Secondary | ICD-10-CM | POA: Diagnosis not present

## 2018-12-25 DIAGNOSIS — M6281 Muscle weakness (generalized): Secondary | ICD-10-CM | POA: Diagnosis not present

## 2018-12-25 DIAGNOSIS — M545 Low back pain: Secondary | ICD-10-CM | POA: Diagnosis not present

## 2018-12-25 DIAGNOSIS — G894 Chronic pain syndrome: Secondary | ICD-10-CM | POA: Diagnosis not present

## 2019-01-22 ENCOUNTER — Encounter: Payer: Self-pay | Admitting: Family Medicine

## 2019-01-22 ENCOUNTER — Ambulatory Visit (INDEPENDENT_AMBULATORY_CARE_PROVIDER_SITE_OTHER): Payer: BLUE CROSS/BLUE SHIELD | Admitting: Family Medicine

## 2019-01-22 VITALS — BP 128/90 | HR 78 | Temp 98.5°F | Wt 228.4 lb

## 2019-01-22 DIAGNOSIS — Z125 Encounter for screening for malignant neoplasm of prostate: Secondary | ICD-10-CM | POA: Diagnosis not present

## 2019-01-22 DIAGNOSIS — Z0000 Encounter for general adult medical examination without abnormal findings: Secondary | ICD-10-CM

## 2019-01-22 LAB — POC URINALSYSI DIPSTICK (AUTOMATED)
BILIRUBIN UA: NEGATIVE
GLUCOSE UA: NEGATIVE
Ketones, UA: NEGATIVE
Leukocytes, UA: NEGATIVE
NITRITE UA: NEGATIVE
Protein, UA: NEGATIVE
RBC UA: NEGATIVE
Spec Grav, UA: 1.015 (ref 1.010–1.025)
Urobilinogen, UA: 0.2 E.U./dL
pH, UA: 6 (ref 5.0–8.0)

## 2019-01-22 LAB — CBC WITH DIFFERENTIAL/PLATELET
Basophils Absolute: 0 10*3/uL (ref 0.0–0.1)
Basophils Relative: 0.6 % (ref 0.0–3.0)
Eosinophils Absolute: 0.1 10*3/uL (ref 0.0–0.7)
Eosinophils Relative: 1.9 % (ref 0.0–5.0)
HCT: 50.1 % (ref 39.0–52.0)
Hemoglobin: 17 g/dL (ref 13.0–17.0)
Lymphocytes Relative: 32 % (ref 12.0–46.0)
Lymphs Abs: 2 10*3/uL (ref 0.7–4.0)
MCHC: 33.9 g/dL (ref 30.0–36.0)
MCV: 88.2 fl (ref 78.0–100.0)
Monocytes Absolute: 0.5 10*3/uL (ref 0.1–1.0)
Monocytes Relative: 8.2 % (ref 3.0–12.0)
Neutro Abs: 3.7 10*3/uL (ref 1.4–7.7)
Neutrophils Relative %: 57.3 % (ref 43.0–77.0)
Platelets: 286 10*3/uL (ref 150.0–400.0)
RBC: 5.68 Mil/uL (ref 4.22–5.81)
RDW: 13.6 % (ref 11.5–15.5)
WBC: 6.4 10*3/uL (ref 4.0–10.5)

## 2019-01-22 LAB — HEPATIC FUNCTION PANEL
ALBUMIN: 4.4 g/dL (ref 3.5–5.2)
ALK PHOS: 73 U/L (ref 39–117)
ALT: 28 U/L (ref 0–53)
AST: 18 U/L (ref 0–37)
BILIRUBIN DIRECT: 0.1 mg/dL (ref 0.0–0.3)
Total Bilirubin: 0.7 mg/dL (ref 0.2–1.2)
Total Protein: 7.6 g/dL (ref 6.0–8.3)

## 2019-01-22 LAB — PSA: PSA: 0.45 ng/mL (ref 0.10–4.00)

## 2019-01-22 LAB — BASIC METABOLIC PANEL
BUN: 18 mg/dL (ref 6–23)
CO2: 29 mEq/L (ref 19–32)
Calcium: 9.3 mg/dL (ref 8.4–10.5)
Chloride: 101 mEq/L (ref 96–112)
Creatinine, Ser: 1.11 mg/dL (ref 0.40–1.50)
GFR: 70.08 mL/min (ref >60.00)
GLUCOSE: 96 mg/dL (ref 70–99)
POTASSIUM: 4.6 meq/L (ref 3.5–5.1)
SODIUM: 138 meq/L (ref 135–145)

## 2019-01-22 LAB — LIPID PANEL
CHOLESTEROL: 150 mg/dL (ref 0–200)
HDL: 45.6 mg/dL (ref >39.00)
LDL CALC: 90 mg/dL (ref 0–99)
NonHDL: 104.47
Total CHOL/HDL Ratio: 3
Triglycerides: 72 mg/dL (ref 0.0–149.0)
VLDL: 14.4 mg/dL (ref 0.0–40.0)

## 2019-01-22 LAB — TSH: TSH: 2.71 u[IU]/mL (ref 0.35–4.50)

## 2019-01-22 MED ORDER — FAMOTIDINE 20 MG PO TABS: 20.0000 mg | ORAL_TABLET | Freq: Every day | ORAL | 0 refills | Status: AC | PRN

## 2019-01-22 MED ORDER — ESCITALOPRAM OXALATE 10 MG PO TABS: 10.0000 mg | ORAL_TABLET | Freq: Every day | ORAL | 2 refills | Status: DC

## 2019-01-22 NOTE — Progress Notes (Signed)
Subjective:    Patient ID: Michael Parker, male    DOB: 08/27/1966, 53 y.o.   MRN: [id number]  HPI Here for a well exam. His only concern today os some anxiety. He has always been an anxious person., and lately some stressors in his life have brought this to the forefront. He often has trouble relaxing and he worries about things. His appetite and sleep are intact.   Review of Systems  Constitutional: Negative.   HENT: Negative.   Eyes: Negative.   Respiratory: Negative.   Cardiovascular: Negative.   Gastrointestinal: Negative.   Genitourinary: Negative.   Musculoskeletal: Negative.   Skin: Negative.   Neurological: Negative.   Psychiatric/Behavioral: Negative for agitation, confusion, decreased concentration and dysphoric mood. The patient is nervous/anxious.        Objective:   Physical Exam Constitutional:      General: He is not in acute distress.    Appearance: He is well-developed. He is not diaphoretic.  HENT:     Head: Normocephalic and atraumatic.     Right Ear: External ear normal.     Left Ear: External ear normal.     Nose: Nose normal.     Mouth/Throat:     Pharynx: No oropharyngeal exudate.  Eyes:     General: No scleral icterus.       Right eye: No discharge.        Left eye: No discharge.     Conjunctiva/sclera: Conjunctivae normal.     Pupils: Pupils are equal, round, and reactive to light.  Neck:     Musculoskeletal: Neck supple.     Thyroid: No thyromegaly.     Vascular: No JVD.     Trachea: No tracheal deviation.  Cardiovascular:     Rate and Rhythm: Normal rate and regular rhythm.     Heart sounds: Normal heart sounds. No murmur. No friction rub. No gallop.   Pulmonary:     Effort: Pulmonary effort is normal. No respiratory distress.     Breath sounds: Normal breath sounds. No wheezing or rales.  Chest:     Chest wall: No tenderness.  Abdominal:     General: Bowel sounds are normal. There is no distension.     Palpations: Abdomen is soft.  There is no mass.     Tenderness: There is no abdominal tenderness. There is no guarding or rebound.  Genitourinary:    Penis: Normal. No tenderness.      Prostate: Normal.     Rectum: Normal. Guaiac result negative.  Musculoskeletal: Normal range of motion.        General: No tenderness.  Lymphadenopathy:     Cervical: No cervical adenopathy.  Skin:    General: Skin is warm and dry.     Coloration: Skin is not pale.     Findings: No erythema or rash.  Neurological:     Mental Status: He is alert and oriented to person, place, and time.     Cranial Nerves: No cranial nerve deficit.     Motor: No abnormal muscle tone.     Coordination: Coordination normal.     Deep Tendon Reflexes: Reflexes are normal and symmetric. Reflexes normal.  Psychiatric:        Behavior: Behavior normal.        Thought Content: Thought content normal.        Judgment: Judgment normal.           Assessment & Plan:  Well exam. We discussed diet  and exercise. Get fasting labs. For the anxiety he will try Lexapro 10 mg daily. He will report back in 2-3 weeks.  Alysia Penna, MD

## 2019-01-25 ENCOUNTER — Encounter: Payer: Self-pay | Admitting: *Deleted

## 2019-06-05 ENCOUNTER — Other Ambulatory Visit: Payer: Self-pay | Admitting: Family Medicine

## 2019-09-06 ENCOUNTER — Other Ambulatory Visit: Payer: Self-pay | Admitting: Family Medicine

## 2019-11-05 ENCOUNTER — Other Ambulatory Visit: Payer: Self-pay | Admitting: Family Medicine

## 2019-12-05 ENCOUNTER — Other Ambulatory Visit: Payer: Self-pay | Admitting: Family Medicine

## 2020-02-03 ENCOUNTER — Other Ambulatory Visit: Payer: Self-pay | Admitting: Family Medicine

## 2020-02-03 NOTE — Telephone Encounter (Signed)
Patient need to schedule an ov for more refills. 

## 2020-02-08 ENCOUNTER — Telehealth: Payer: Self-pay | Admitting: Family Medicine

## 2020-02-08 NOTE — Telephone Encounter (Signed)
Medication Refill: escitalopram  Pharmacy: [facility] [address]  Phone:

## 2020-02-08 NOTE — Telephone Encounter (Signed)
Patient needs an OV for further refills. Left message for patient to call back.

## 2020-02-14 ENCOUNTER — Other Ambulatory Visit: Payer: Self-pay

## 2020-02-14 NOTE — Telephone Encounter (Signed)
Left message for patient to call back  

## 2020-02-15 ENCOUNTER — Encounter: Payer: Self-pay | Admitting: Family Medicine

## 2020-02-15 ENCOUNTER — Ambulatory Visit (INDEPENDENT_AMBULATORY_CARE_PROVIDER_SITE_OTHER): Payer: BC Managed Care – PPO | Admitting: Family Medicine

## 2020-02-15 VITALS — BP 136/80 | HR 77 | Temp 98.2°F | Wt 240.2 lb

## 2020-02-15 DIAGNOSIS — I10 Essential (primary) hypertension: Secondary | ICD-10-CM

## 2020-02-15 DIAGNOSIS — F419 Anxiety disorder, unspecified: Secondary | ICD-10-CM | POA: Diagnosis not present

## 2020-02-15 MED ORDER — ESCITALOPRAM OXALATE 10 MG PO TABS: 10.0000 mg | ORAL_TABLET | Freq: Every day | ORAL | 3 refills | Status: DC

## 2020-02-15 NOTE — Telephone Encounter (Signed)
Caller states her husband is needing a refill on Lexapro 10 mg 1 tablet daily. Patient uses [facility] representative. Patient is not currently having any new or worsening symptoms at this tis time.

## 2020-02-15 NOTE — Progress Notes (Signed)
   Subjective:    Patient ID: Michael Parker, male    DOB: May 18, 1966, 54 y.o.   MRN: [id number]  HPI Here to follow up. He feels great. He is pleased with Lexapro and he wants to stay on it. He feels less stressed and his wife says he is less irritable. His BP is stable. He brings in recent lab results from a life insurance exam, and these are all normal.   Review of Systems  Constitutional: Negative.   Respiratory: Negative.   Cardiovascular: Negative.   Neurological: Negative.   Psychiatric/Behavioral: Negative.        Objective:   Physical Exam Constitutional:      Appearance: Normal appearance.  Cardiovascular:     Rate and Rhythm: Normal rate and regular rhythm.     Pulses: Normal pulses.     Heart sounds: Normal heart sounds.  Pulmonary:     Effort: Pulmonary effort is normal.     Breath sounds: Normal breath sounds.  Neurological:     General: No focal deficit present.     Mental Status: He is alert and oriented to person, place, and time.  Psychiatric:        Mood and Affect: Mood normal.        Thought Content: Thought content normal.        Judgment: Judgment normal.           Assessment & Plan:  His HTN is stable. His

## 2020-02-15 NOTE — Telephone Encounter (Signed)
Patient has an appointment with Dr. Sarajane Jews today. This will be addressed at the appointment.

## 2020-10-12 ENCOUNTER — Telehealth: Payer: Self-pay | Admitting: *Deleted

## 2020-10-12 NOTE — Telephone Encounter (Signed)
Patient called stating he tested positive for covid yesterday and his symptoms are very mild but he wasn't sure if Dr Sarajane Jews would recommend anything for him to do.    [phone]

## 2020-10-13 NOTE — Telephone Encounter (Signed)
Just rest and drink fluids. He needs to quarantine for 10 days

## 2020-10-13 NOTE — Telephone Encounter (Signed)
Please advise 

## 2020-10-16 NOTE — Telephone Encounter (Signed)
Please help, no longer working at your office.

## 2020-10-30 ENCOUNTER — Other Ambulatory Visit: Payer: Self-pay | Admitting: Family Medicine

## 2021-01-23 DIAGNOSIS — M47817 Spondylosis without myelopathy or radiculopathy, lumbosacral region: Secondary | ICD-10-CM | POA: Diagnosis not present

## 2021-01-23 DIAGNOSIS — M5136 Other intervertebral disc degeneration, lumbar region: Secondary | ICD-10-CM | POA: Diagnosis not present

## 2021-01-28 ENCOUNTER — Other Ambulatory Visit: Payer: Self-pay | Admitting: Family Medicine

## 2021-03-21 ENCOUNTER — Other Ambulatory Visit: Payer: Self-pay | Admitting: Family Medicine

## 2021-03-27 ENCOUNTER — Encounter: Payer: Self-pay | Admitting: Family Medicine

## 2021-03-27 ENCOUNTER — Telehealth (INDEPENDENT_AMBULATORY_CARE_PROVIDER_SITE_OTHER): Payer: BC Managed Care – PPO | Admitting: Family Medicine

## 2021-03-27 VITALS — Wt 240.0 lb

## 2021-03-27 DIAGNOSIS — F419 Anxiety disorder, unspecified: Secondary | ICD-10-CM

## 2021-03-27 MED ORDER — ESCITALOPRAM OXALATE 10 MG PO TABS: 10.0000 mg | ORAL_TABLET | Freq: Every day | ORAL | 3 refills | Status: DC

## 2021-03-27 NOTE — Progress Notes (Signed)
Subjective:    Patient ID: Michael Parker, male    DOB: 04/09/1966, 55 y.o.   MRN: [id number]  HPI Virtual Visit via Video Note  I connected with the patient on 03/27/21 at 11:00 AM EDT by a video enabled telemedicine application and verified that I am speaking with the correct person using two identifiers.  Location patient: home Location provider:work or home office Persons participating in the virtual visit: patient, provider  I discussed the limitations of evaluation and management by telemedicine and the availability of in person appointments. The patient expressed understanding and agreed to proceed.   HPI: Here to follow on anxiety. He is doing well. He wants to stay on Lexapro as it is. His wife agrees that his anxiety is well controlled.    ROS: See pertinent positives and negatives per HPI.  Past Medical History:  Diagnosis Date  . Chickenpox   . Herniated disc   . Hyperlipemia   . Hypertension   . Irritable bowel syndrome (IBS)    sees Dr. Laurence Spates  . Obesity   . Premature atrial contractions 02/2014   documented on event monitor for palpitations     Past Surgical History:  Procedure Laterality Date  . none      Family History  Problem Relation Age of Onset  . Sudden death Cousin 46  . Heart attack Father        multiple MIs  . Aneurysm Father   . AAA (abdominal aortic aneurysm) Father   . Other Father        Smoker's lung  . Arrhythmia Mother        AFIB  . Diabetes Mother   . Other Sister        Vertigo  . Heart attack Maternal Grandfather 39  . Depression Other        family hx  . Hyperlipidemia Other        family hx  . Hypertension Other        family hx     Current Outpatient Medications:  .  cetirizine (ZYRTEC) 10 MG tablet, Take 10 mg by mouth daily., Disp: , Rfl:  .  famotidine (PEPCID) 20 MG tablet, Take 1 tablet (20 mg total) by mouth daily as needed for heartburn or indigestion., Disp: 1 tablet, Rfl: 0 .  losartan (COZAAR)  50 MG tablet, TAKE 1 TABLET(50 MG) BY MOUTH DAILY, Disp: 90 tablet, Rfl: 3 .  escitalopram (LEXAPRO) 10 MG tablet, Take 1 tablet (10 mg total) by mouth daily., Disp: 90 tablet, Rfl: 3  EXAM:  VITALS per patient if applicable:  GENERAL: alert, oriented, appears well and in no acute distress  HEENT: atraumatic, conjunttiva clear, no obvious abnormalities on inspection of external nose and ears  NECK: normal movements of the head and neck  LUNGS: on inspection no signs of respiratory distress, breathing rate appears normal, no obvious gross SOB, gasping or wheezing  CV: no obvious cyanosis  MS: moves all visible extremities without noticeable abnormality  PSYCH/NEURO: pleasant and cooperative, no obvious depression or anxiety, speech and thought processing grossly intact  ASSESSMENT AND PLAN: Anxiety, stable. The Lexapro is refilled. He will set up a well exam with labs sometime soon.  Alysia Penna, MD   Anxiety disorder, unspecified type     I discussed the assessment and treatment plan with the patient. The patient was provided an opportunity to ask questions and all were answered. The patient agreed with the plan and demonstrated an understanding  of the instructions.   The patient was advised to call back or seek an in-person evaluation if the symptoms worsen or if the condition fails to improve as anticipated.     Review of Systems     Objective:   Physical Exam        Assessment & Plan:

## 2021-04-19 ENCOUNTER — Other Ambulatory Visit: Payer: Self-pay

## 2021-04-20 ENCOUNTER — Encounter: Payer: Self-pay | Admitting: Family Medicine

## 2021-04-20 ENCOUNTER — Ambulatory Visit (INDEPENDENT_AMBULATORY_CARE_PROVIDER_SITE_OTHER): Payer: BC Managed Care – PPO | Admitting: Family Medicine

## 2021-04-20 VITALS — BP 168/98 | HR 72 | Temp 97.4°F | Ht 73.0 in | Wt 238.0 lb

## 2021-04-20 DIAGNOSIS — Z125 Encounter for screening for malignant neoplasm of prostate: Secondary | ICD-10-CM | POA: Diagnosis not present

## 2021-04-20 DIAGNOSIS — Z0000 Encounter for general adult medical examination without abnormal findings: Secondary | ICD-10-CM

## 2021-04-20 LAB — CBC WITH DIFFERENTIAL/PLATELET
Basophils Absolute: 0 10*3/uL (ref 0.0–0.1)
Basophils Relative: 0.7 % (ref 0.0–3.0)
Eosinophils Absolute: 0.1 10*3/uL (ref 0.0–0.7)
Eosinophils Relative: 2.5 % (ref 0.0–5.0)
HCT: 41.7 % (ref 39.0–52.0)
Hemoglobin: 14.4 g/dL (ref 13.0–17.0)
Lymphocytes Relative: 29.8 % (ref 12.0–46.0)
Lymphs Abs: 1.7 10*3/uL (ref 0.7–4.0)
MCHC: 34.5 g/dL (ref 30.0–36.0)
MCV: 87.5 fl (ref 78.0–100.0)
Monocytes Absolute: 0.5 10*3/uL (ref 0.1–1.0)
Monocytes Relative: 8.8 % (ref 3.0–12.0)
Neutro Abs: 3.3 10*3/uL (ref 1.4–7.7)
Neutrophils Relative %: 58.2 % (ref 43.0–77.0)
Platelets: 244 10*3/uL (ref 150.0–400.0)
RBC: 4.76 Mil/uL (ref 4.22–5.81)
RDW: 14.1 % (ref 11.5–15.5)
WBC: 5.7 10*3/uL (ref 4.0–10.5)

## 2021-04-20 LAB — HEPATIC FUNCTION PANEL
ALT: 23 U/L (ref 0–53)
AST: 17 U/L (ref 0–37)
Albumin: 4.1 g/dL (ref 3.5–5.2)
Alkaline Phosphatase: 66 U/L (ref 39–117)
Bilirubin, Direct: 0.2 mg/dL (ref 0.0–0.3)
Total Bilirubin: 1 mg/dL (ref 0.2–1.2)
Total Protein: 7 g/dL (ref 6.0–8.3)

## 2021-04-20 LAB — HEMOGLOBIN A1C: Hgb A1c MFr Bld: 6.1 % (ref 4.6–6.5)

## 2021-04-20 LAB — BASIC METABOLIC PANEL
BUN: 16 mg/dL (ref 6–23)
CO2: 26 mEq/L (ref 19–32)
Calcium: 8.8 mg/dL (ref 8.4–10.5)
Chloride: 104 mEq/L (ref 96–112)
Creatinine, Ser: 1.05 mg/dL (ref 0.40–1.50)
GFR: 81.68 mL/min (ref >60.00)
Glucose, Bld: 100 mg/dL — ABNORMAL HIGH (ref 70–99)
Potassium: 4.4 mEq/L (ref 3.5–5.1)
Sodium: 138 mEq/L (ref 135–145)

## 2021-04-20 LAB — LIPID PANEL
Cholesterol: 130 mg/dL (ref 0–200)
HDL: 41.7 mg/dL (ref >39.00)
LDL Cholesterol: 79 mg/dL (ref 0–99)
NonHDL: 88.77
Total CHOL/HDL Ratio: 3
Triglycerides: 47 mg/dL (ref 0.0–149.0)
VLDL: 9.4 mg/dL (ref 0.0–40.0)

## 2021-04-20 LAB — PSA: PSA: 1.48 ng/mL (ref 0.10–4.00)

## 2021-04-20 LAB — TSH: TSH: 1.99 u[IU]/mL (ref 0.35–4.50)

## 2021-04-20 LAB — T4, FREE: Free T4: 0.83 ng/dL (ref 0.60–1.60)

## 2021-04-20 LAB — T3, FREE: T3, Free: 3.3 pg/mL (ref 2.3–4.2)

## 2021-04-20 MED ORDER — LOSARTAN POTASSIUM-HCTZ 100-25 MG PO TABS: 1.0000 | ORAL_TABLET | Freq: Every day | ORAL | 3 refills | Status: DC

## 2021-04-20 NOTE — Progress Notes (Signed)
Subjective:    Patient ID: Michael Parker, male    DOB: 16-Oct-1966, 55 y.o.   MRN: [id number]  HPI Here for a well exam. He feels fine. He has not checked his BP at home in over a year. He has gained about 17 lbs in the past few months.    Review of Systems  Constitutional: Negative.   HENT: Negative.   Eyes: Negative.   Respiratory: Negative.   Cardiovascular: Negative.   Gastrointestinal: Negative.   Genitourinary: Negative.   Musculoskeletal: Negative.   Skin: Negative.   Neurological: Negative.   Psychiatric/Behavioral: Negative.        Objective:   Physical Exam Constitutional:      General: He is not in acute distress.    Appearance: Normal appearance. He is well-developed. He is not diaphoretic.  HENT:     Head: Normocephalic and atraumatic.     Right Ear: External ear normal.     Left Ear: External ear normal.     Nose: Nose normal.     Mouth/Throat:     Pharynx: No oropharyngeal exudate.  Eyes:     General: No scleral icterus.       Right eye: No discharge.        Left eye: No discharge.     Conjunctiva/sclera: Conjunctivae normal.     Pupils: Pupils are equal, round, and reactive to light.  Neck:     Thyroid: No thyromegaly.     Vascular: No JVD.     Trachea: No tracheal deviation.  Cardiovascular:     Rate and Rhythm: Normal rate and regular rhythm.     Heart sounds: Normal heart sounds. No murmur heard. No friction rub. No gallop.   Pulmonary:     Effort: Pulmonary effort is normal. No respiratory distress.     Breath sounds: Normal breath sounds. No wheezing or rales.  Chest:     Chest wall: No tenderness.  Abdominal:     General: Bowel sounds are normal. There is no distension.     Palpations: Abdomen is soft. There is no mass.     Tenderness: There is no abdominal tenderness. There is no guarding or rebound.  Genitourinary:    Penis: Normal. No tenderness.      Testes: Normal.     Prostate: Normal.     Rectum: Normal. Guaiac result  negative.  Musculoskeletal:        General: No tenderness. Normal range of motion.     Cervical back: Neck supple.  Lymphadenopathy:     Cervical: No cervical adenopathy.  Skin:    General: Skin is warm and dry.     Coloration: Skin is not pale.     Findings: No erythema or rash.  Neurological:     Mental Status: He is alert and oriented to person, place, and time.     Cranial Nerves: No cranial nerve deficit.     Motor: No abnormal muscle tone.     Coordination: Coordination normal.     Deep Tendon Reflexes: Reflexes are normal and symmetric. Reflexes normal.  Psychiatric:        Behavior: Behavior normal.        Thought Content: Thought content normal.        Judgment: Judgment normal.           Assessment & Plan:  Well exam. We discussed diet and exercise. Get fasting labs. For the HTN, we will stop Losartan and will switch to Losartan HCT  100-25 daily. Set up a colonoscopy.  Alysia Penna, MD

## 2021-04-20 NOTE — Addendum Note (Signed)
Addended by: Janann Colonel on: 04/20/2021 10:37 AM   Modules accepted: Orders

## 2021-05-31 DIAGNOSIS — I10 Essential (primary) hypertension: Secondary | ICD-10-CM | POA: Diagnosis not present

## 2021-05-31 DIAGNOSIS — H6691 Otitis media, unspecified, right ear: Secondary | ICD-10-CM | POA: Diagnosis not present

## 2021-05-31 DIAGNOSIS — H6983 Other specified disorders of Eustachian tube, bilateral: Secondary | ICD-10-CM | POA: Diagnosis not present

## 2021-07-26 ENCOUNTER — Encounter: Payer: Self-pay | Admitting: Gastroenterology

## 2021-08-17 ENCOUNTER — Ambulatory Visit (AMBULATORY_SURGERY_CENTER): Payer: BC Managed Care – PPO

## 2021-08-17 ENCOUNTER — Other Ambulatory Visit: Payer: Self-pay

## 2021-08-17 ENCOUNTER — Encounter: Payer: Self-pay | Admitting: Gastroenterology

## 2021-08-17 VITALS — Ht 73.0 in | Wt 235.0 lb

## 2021-08-17 DIAGNOSIS — Z1211 Encounter for screening for malignant neoplasm of colon: Secondary | ICD-10-CM

## 2021-08-17 MED ORDER — CLENPIQ 10-3.5-12 MG-GM -GM/160ML PO SOLN: 1.0000 | ORAL | 0 refills | Status: DC

## 2021-08-17 NOTE — Progress Notes (Signed)
Pre visit completed via phone call; Patient verified name, DOB, and address; No egg or soy allergy known to patient  No issues with past sedation with any surgeries or procedures Patient denies ever being told they had issues or difficulty with intubation  No FH of Malignant Hyperthermia No diet pills per patient No home 02 use per patient  No blood thinners per patient  Pt denies issues with constipation at this time-was dx with IBS years ago- rotates between diarrhea and constipation  No A fib or A flutter  EMMI video via MyChart  COVID 19 guidelines implemented in [place] today with Pt and RN   Pt is fully vaccinated for Covid x 2;  Coupon given to pt in PV today, Code to Pharmacy and NO PA's for preps discussed with pt in PV today  Discussed with pt there will be an out-of-pocket cost for prep and that varies from $0 to 70 +  dollars   Due to the COVID-19 pandemic we are asking patients to follow certain guidelines.  Pt aware of COVID protocols and [facility] guidelines

## 2021-08-31 ENCOUNTER — Encounter: Payer: Self-pay | Admitting: Gastroenterology

## 2021-08-31 ENCOUNTER — Ambulatory Visit (AMBULATORY_SURGERY_CENTER): Payer: BC Managed Care – PPO | Admitting: Gastroenterology

## 2021-08-31 ENCOUNTER — Other Ambulatory Visit: Payer: Self-pay

## 2021-08-31 VITALS — BP 97/61 | HR 67 | Temp 98.4°F | Resp 14 | Ht 73.0 in | Wt 235.0 lb

## 2021-08-31 DIAGNOSIS — D123 Benign neoplasm of transverse colon: Secondary | ICD-10-CM

## 2021-08-31 DIAGNOSIS — D122 Benign neoplasm of ascending colon: Secondary | ICD-10-CM

## 2021-08-31 DIAGNOSIS — Z1211 Encounter for screening for malignant neoplasm of colon: Secondary | ICD-10-CM

## 2021-08-31 DIAGNOSIS — D125 Benign neoplasm of sigmoid colon: Secondary | ICD-10-CM | POA: Diagnosis not present

## 2021-08-31 DIAGNOSIS — K640 First degree hemorrhoids: Secondary | ICD-10-CM

## 2021-08-31 DIAGNOSIS — K5730 Diverticulosis of large intestine without perforation or abscess without bleeding: Secondary | ICD-10-CM

## 2021-08-31 HISTORY — PX: COLONOSCOPY: SHX174

## 2021-08-31 MED ORDER — SODIUM CHLORIDE 0.9 % IV SOLN: 500.0000 mL | INTRAVENOUS | Status: DC

## 2021-08-31 NOTE — Op Note (Signed)
[facility] Patient Name: Jordell Outten Procedure Date: 08/31/2021 8:20 AM MRN: [id number] Endoscopist: Gerrit Heck , MD Age: 55 Referring MD:  Date of Birth: 1966-10-15 Gender: Male Account #: [id number] Procedure:                Colonoscopy Indications:              Screening for colorectal malignant neoplasm, This                            is the patient's first colonoscopy Medicines:                Monitored Anesthesia Care Procedure:                Pre-Anesthesia Assessment:                           - Prior to the procedure, a History and Physical                            was performed, and patient medications and                            allergies were reviewed. The patient's tolerance of                            previous anesthesia was also reviewed. The risks                            and benefits of the procedure and the sedation                            options and risks were discussed with the patient.                            All questions were answered, and informed consent                            was obtained. Prior Anticoagulants: The patient has                            taken no previous anticoagulant or antiplatelet                            agents. ASA Grade Assessment: II - A patient with                            mild systemic disease. After reviewing the risks                            and benefits, the patient was deemed in                            satisfactory condition to undergo the procedure.  After obtaining informed consent, the colonoscope                            was passed under direct vision. Throughout the                            procedure, the patient's blood pressure, pulse, and                            oxygen saturations were monitored continuously. The                            Olympus CF-HQ190L (Serial# [id number]) Colonoscope was                            introduced through the anus  and advanced to the the                            cecum, identified by appendiceal orifice and                            ileocecal valve. The colonoscopy was performed                            without difficulty. The patient tolerated the                            procedure well. The quality of the bowel                            preparation was good. The ileocecal valve,                            appendiceal orifice, and rectum were photographed. Scope In: 8:32:23 AM Scope Out: 8:54:19 AM Scope Withdrawal Time: 0 hours 17 minutes 52 seconds  Total Procedure Duration: 0 hours 21 minutes 56 seconds  Findings:                 The perianal and digital rectal examinations were                            normal.                           Two sessile polyps were found in the transverse                            colon and ascending colon. The polyps were 3 to 5                            mm in size. These polyps were removed with a cold                            snare. Resection and retrieval were complete.  Estimated blood loss was minimal.                           A 3 mm polyp was found in the sigmoid colon. The                            polyp was sessile. The polyp was removed with a                            cold snare. Resection and retrieval were complete.                            Estimated blood loss was minimal.                           A few small-mouthed diverticula were found in the                            sigmoid colon.                           Non-bleeding internal hemorrhoids were found during                            retroflexion. The hemorrhoids were small. Complications:            No immediate complications. Estimated Blood Loss:     Estimated blood loss was minimal. Impression:               - Two 3 to 5 mm polyps in the transverse colon and                            in the ascending colon, removed with a cold snare.                             Resected and retrieved.                           - One 3 mm polyp in the sigmoid colon, removed with                            a cold snare. Resected and retrieved.                           - Diverticulosis in the sigmoid colon.                           - Non-bleeding internal hemorrhoids. Recommendation:           - Patient has a contact number available for                            emergencies. The signs and symptoms of potential  delayed complications were discussed with the                            patient. Return to normal activities tomorrow.                            Written discharge instructions were provided to the                            patient.                           - Resume previous diet.                           - Continue present medications.                           - Await pathology results.                           - Repeat colonoscopy for surveillance based on                            pathology results.                           - Return to GI clinic PRN.                           - Use fiber, for example Citrucel, Fibercon, Konsyl                            or Metamucil.                           - Internal hemorrhoids were noted on this study and                            may be amenable to hemorrhoid band ligation. If you                            are interested in further treatment of these                            hemorrhoids with band ligation, please contact my                            clinic to set up an appointment for evaluation and                            treatment. Gerrit Heck, MD 08/31/2021 8:57:52 AM

## 2021-08-31 NOTE — Progress Notes (Signed)
Pt's states no medical or surgical changes since previsit or office visit. 

## 2021-08-31 NOTE — Progress Notes (Signed)
Report given to PACU, vss 

## 2021-08-31 NOTE — Patient Instructions (Signed)
Handout given:  polyps, diverticulosis Resume previous diet Continue current medications:  add supplemental fiber, ie: metamucil, fibercon, citrucel Await pathology results  YOU HAD AN ENDOSCOPIC PROCEDURE TODAY AT [facility]:   Refer to the procedure report that was given to you for any specific questions about what was found during the examination.  If the procedure report does not answer your questions, please call your gastroenterologist to clarify.  If you requested that your care partner not be given the details of your procedure findings, then the procedure report has been included in a sealed envelope for you to review at your convenience later.  YOU SHOULD EXPECT: Some feelings of bloating in the abdomen. Passage of more gas than usual.  Walking can help get rid of the air that was put into your GI tract during the procedure and reduce the bloating. If you had a lower endoscopy (such as a colonoscopy or flexible sigmoidoscopy) you may notice spotting of blood in your stool or on the toilet paper. If you underwent a bowel prep for your procedure, you may not have a normal bowel movement for a few days.  Please Note:  You might notice some irritation and congestion in your nose or some drainage.  This is from the oxygen used during your procedure.  There is no need for concern and it should clear up in a day or so.  SYMPTOMS TO REPORT IMMEDIATELY:  Following lower endoscopy (colonoscopy or flexible sigmoidoscopy):  Excessive amounts of blood in the stool  Significant tenderness or worsening of abdominal pains  Swelling of the abdomen that is new, acute  Fever of 100F or higher  For urgent or emergent issues, a gastroenterologist can be reached at any hour by calling [phone]. Do not use MyChart messaging for urgent concerns.   DIET:  We do recommend a small meal at first, but then you may proceed to your regular diet.  Drink plenty of fluids but you should avoid  alcoholic beverages for 24 hours.  ACTIVITY:  You should plan to take it easy for the rest of today and you should NOT DRIVE or use heavy machinery until tomorrow (because of the sedation medicines used during the test).    FOLLOW UP: Our staff will call the number listed on your records 48-72 hours following your procedure to check on you and address any questions or concerns that you may have regarding the information given to you following your procedure. If we do not reach you, we will leave a message.  We will attempt to reach you two times.  During this call, we will ask if you have developed any symptoms of COVID 19. If you develop any symptoms (ie: fever, flu-like symptoms, shortness of breath, cough etc.) before then, please call [phone].  If you test positive for Covid 19 in the 2 weeks post procedure, please call and report this information to [place].    If any biopsies were taken you will be contacted by phone or by letter within the next 1-3 weeks.  Please call us at [phone] if you have not heard about the biopsies in 3 weeks.   SIGNATURES/CONFIDENTIALITY: You and/or your care partner have signed paperwork which will be entered into your electronic medical record.  These signatures attest to the fact that that the information above on your After Visit Summary has been reviewed and is understood.  Full responsibility of the confidentiality of this discharge information lies with you and/or your  care-partner.  

## 2021-08-31 NOTE — Progress Notes (Signed)
Called to room to assist during endoscopic procedure.  Patient ID and intended procedure confirmed with present staff. Received instructions for my participation in the procedure from the performing physician.  

## 2021-08-31 NOTE — Progress Notes (Signed)
GASTROENTEROLOGY PROCEDURE H&P NOTE   Primary Care Physician: Laurey Morale, MD    Reason for Procedure:  Colon Cancer screening  Plan:    Colonoscopy  Patient is appropriate for endoscopic procedure(s) in the ambulatory ([facility]) setting.  The nature of the procedure, as well as the risks, benefits, and alternatives were carefully and thoroughly reviewed with the patient. Ample time for discussion and questions allowed. The patient understood, was satisfied, and agreed to proceed.     HPI: Michael Parker is a 55 y.o. male who presents for colonoscopy for routine Colon Cancer screening.  No active GI symptoms.  No known family history of colon cancer or related malignancy.  Patient is otherwise without complaints or active issues today.  Past Medical History:  Diagnosis Date   Chickenpox    GERD (gastroesophageal reflux disease)    with certain foods   Glaucoma 2019   narrow angle glaucoma-bilateral   Herniated disc    Hyperlipemia    on meds   Hypertension    on meds   Irritable bowel syndrome (IBS)    sees Dr. Laurence Spates   Obesity    Premature atrial contractions 02/06/2014   documented on event monitor for palpitations -dx by Thompson Grayer, cardiology   Seasonal allergies     Past Surgical History:  Procedure Laterality Date   GLAUCOMA SURGERY Bilateral 2019   UPPER GASTROINTESTINAL ENDOSCOPY  2014   Dr. Laurence Spates    Prior to Admission medications   Medication Sig Start Date End Date Taking? Authorizing Provider  escitalopram (LEXAPRO) 10 MG tablet Take 1 tablet (10 mg total) by mouth daily. 03/27/21  Yes Laurey Morale, MD  famotidine (PEPCID) 20 MG tablet Take 1 tablet (20 mg total) by mouth daily as needed for heartburn or indigestion. 01/22/19  Yes Laurey Morale, MD  Fexofenadine HCl [facility] ALLERGY PO) Take 1 tablet by mouth daily at 6 (six) AM.   Yes [provider]  losartan-hydrochlorothiazide (HYZAAR) 100-25 MG tablet Take 1 tablet by  mouth daily. 04/20/21  Yes Laurey Morale, MD    Current Outpatient Medications  Medication Sig Dispense Refill   escitalopram (LEXAPRO) 10 MG tablet Take 1 tablet (10 mg total) by mouth daily. 90 tablet 3   famotidine (PEPCID) 20 MG tablet Take 1 tablet (20 mg total) by mouth daily as needed for heartburn or indigestion. 1 tablet 0   Fexofenadine HCl (ALLEGRA ALLERGY PO) Take 1 tablet by mouth daily at 6 (six) AM.     losartan-hydrochlorothiazide (HYZAAR) 100-25 MG tablet Take 1 tablet by mouth daily. 90 tablet 3   Current Facility-Administered Medications  Medication Dose Route Frequency Provider Last Rate Last Admin   0.9 %  sodium chloride infusion  500 mL Intravenous Continuous Amedee Cerrone V, DO        Allergies as of 08/31/2021 - Review Complete 08/31/2021  Allergen Reaction Noted   Cortisone Palpitations 02/06/2015   Ace inhibitors Cough 06/08/2014   Codeine  03/10/2008   Norvasc [amlodipine besylate] Swelling 06/08/2014    Family History  Problem Relation Age of Onset   Colon polyps Mother 80   Arrhythmia Mother        AFIB   Diabetes Mother    Heart attack Father        multiple MIs   Aneurysm Father    AAA (abdominal aortic aneurysm) Father    Other Father        Smoker's lung   Other  Sister        Vertigo   Heart attack Maternal Grandfather 7   Sudden death Cousin 33   Depression Other        family hx   Hyperlipidemia Other        family hx   Hypertension Other        family hx   Colon cancer Neg Hx    Esophageal cancer Neg Hx    Stomach cancer Neg Hx    Rectal cancer Neg Hx     Social History   Socioeconomic History   Marital status: Married    Spouse name: Not on file   Number of children: Not on file   Years of education: Not on file   Highest education level: Not on file  Occupational History   Not on file  Tobacco Use   Smoking status: Never   Smokeless tobacco: Never  Vaping Use   Vaping Use: Never used  Substance and Sexual  Activity   Alcohol use: No    Alcohol/week: 0.0 standard drinks   Drug use: No   Sexual activity: Not on file  Other Topics Concern   Not on file  Social History Narrative   Not on file   Social Determinants of Health   Financial Resource Strain: Not on file  Food Insecurity: Not on file  Transportation Needs: Not on file  Physical Activity: Not on file  Stress: Not on file  Social Connections: Not on file  Intimate Partner Violence: Not on file    Physical Exam: Vital signs in last 24 hours: @BP  (!) 155/90   Pulse 84   Temp 98.4 F (36.9 C) (Temporal)   Resp (!) 25   Ht 6\' 1"  (1.854 m)   Wt 235 lb (106.6 kg)   SpO2 98%   BMI 31.00 kg/m  GEN: NAD EYE: Sclerae anicteric ENT: MMM CV: Non-tachycardic Pulm: CTA b/l GI: Soft, NT/ND NEURO:  Alert & Oriented x 3   Gerrit Heck, DO [facility]   08/31/2021 8:23 AM

## 2021-09-03 ENCOUNTER — Telehealth: Payer: Self-pay

## 2021-09-03 NOTE — Telephone Encounter (Signed)
  Follow up Call-  Call back number 08/31/2021  Post procedure Call Back phone  # [phone]  Permission to leave phone message Yes  Some recent data might be hidden     Patient questions:  Do you have a fever, pain , or abdominal swelling? No. Pain Score  0 *  Have you tolerated food without any problems? Yes.    Have you been able to return to your normal activities? Yes.    Do you have any questions about your discharge instructions: Diet   No. Medications  No. Follow up visit  No.  Do you have questions or concerns about your Care? No.  Actions: * If pain score is 4 or above: No action needed, pain <4.

## 2021-09-10 ENCOUNTER — Encounter: Payer: Self-pay | Admitting: Gastroenterology

## 2021-10-29 DIAGNOSIS — R6889 Other general symptoms and signs: Secondary | ICD-10-CM | POA: Diagnosis not present

## 2021-10-29 DIAGNOSIS — I10 Essential (primary) hypertension: Secondary | ICD-10-CM | POA: Diagnosis not present

## 2021-10-29 DIAGNOSIS — J101 Influenza due to other identified influenza virus with other respiratory manifestations: Secondary | ICD-10-CM | POA: Diagnosis not present

## 2022-01-29 ENCOUNTER — Encounter: Payer: Self-pay | Admitting: Family Medicine

## 2022-01-29 ENCOUNTER — Ambulatory Visit: Payer: BC Managed Care – PPO | Admitting: Family Medicine

## 2022-01-29 VITALS — BP 140/100 | HR 75 | Temp 98.3°F | Wt 242.0 lb

## 2022-01-29 DIAGNOSIS — I10 Essential (primary) hypertension: Secondary | ICD-10-CM | POA: Diagnosis not present

## 2022-01-29 MED ORDER — LOSARTAN POTASSIUM-HCTZ 100-25 MG PO TABS: 1.0000 | ORAL_TABLET | Freq: Every day | ORAL | 3 refills | Status: DC

## 2022-01-29 NOTE — Progress Notes (Signed)
° °  Subjective:    Patient ID: Michael Parker, male    DOB: 15-Feb-1966, 56 y.o.   MRN: [id number]  HPI Here to follow up on HTN. He feels well. His BP at home averages in the 130s over 80s. He says the BP is higher today because he ran out of medication 2 days ago.    Review of Systems  Constitutional: Negative.   Respiratory: Negative.    Cardiovascular: Negative.       Objective:   Physical Exam Constitutional:      Appearance: Normal appearance.  Cardiovascular:     Rate and Rhythm: Normal rate and regular rhythm.     Pulses: Normal pulses.     Heart sounds: Normal heart sounds.  Pulmonary:     Effort: Pulmonary effort is normal.     Breath sounds: Normal breath sounds.  Neurological:     Mental Status: He is alert.          Assessment & Plan:  HTN, stable. Refilled the Losartan HCT.  Alysia Penna, MD

## 2022-04-03 ENCOUNTER — Other Ambulatory Visit: Payer: Self-pay | Admitting: Family Medicine

## 2022-06-14 DIAGNOSIS — H66002 Acute suppurative otitis media without spontaneous rupture of ear drum, left ear: Secondary | ICD-10-CM | POA: Diagnosis not present

## 2022-06-14 DIAGNOSIS — H60392 Other infective otitis externa, left ear: Secondary | ICD-10-CM | POA: Diagnosis not present

## 2022-06-14 DIAGNOSIS — Z6830 Body mass index (BMI) 30.0-30.9, adult: Secondary | ICD-10-CM | POA: Diagnosis not present

## 2022-07-02 ENCOUNTER — Other Ambulatory Visit: Payer: Self-pay | Admitting: Family Medicine

## 2022-09-30 ENCOUNTER — Other Ambulatory Visit: Payer: Self-pay | Admitting: Family Medicine

## 2022-10-07 ENCOUNTER — Telehealth: Payer: Self-pay | Admitting: Family Medicine

## 2022-10-07 ENCOUNTER — Other Ambulatory Visit: Payer: Self-pay

## 2022-10-07 MED ORDER — ESCITALOPRAM OXALATE 10 MG PO TABS: ORAL_TABLET | ORAL | 0 refills | Status: DC

## 2022-10-07 NOTE — Telephone Encounter (Signed)
Resent refill to [facility] on [address].

## 2022-10-07 NOTE — Telephone Encounter (Signed)
Pt call and stated he need a refill on escitalopram (LEXAPRO) 10 MG tablet sent pt  [id number] - [place], [place] [address] [place] [address] Phone:  [phone]  Fax:  [phone]

## 2023-01-15 ENCOUNTER — Telehealth: Payer: BC Managed Care – PPO | Admitting: Family Medicine

## 2023-01-15 ENCOUNTER — Encounter: Payer: Self-pay | Admitting: Family Medicine

## 2023-01-15 DIAGNOSIS — J019 Acute sinusitis, unspecified: Secondary | ICD-10-CM | POA: Diagnosis not present

## 2023-01-15 DIAGNOSIS — I10 Essential (primary) hypertension: Secondary | ICD-10-CM

## 2023-01-15 MED ORDER — LOSARTAN POTASSIUM-HCTZ 50-12.5 MG PO TABS: 1.0000 | ORAL_TABLET | Freq: Every day | ORAL | 3 refills | Status: DC

## 2023-01-15 MED ORDER — AZITHROMYCIN 250 MG PO TABS: ORAL_TABLET | ORAL | 0 refills | Status: DC

## 2023-01-15 NOTE — Progress Notes (Signed)
Subjective:    Patient ID: Michael Parker, male    DOB: Aug 24, 1966, 57 y.o.   MRN: [id number]  HPI Virtual Visit via Video Note  I connected with the patient on 01/15/23 at  3:00 PM EST by a video enabled telemedicine application and verified that I am speaking with the correct person using two identifiers.  Location patient: home Location provider:work or home office Persons participating in the virtual visit: patient, provider  I discussed the limitations of evaluation and management by telemedicine and the availability of in person appointments. The patient expressed understanding and agreed to proceed.   HPI: Here for 2 issues. First 7 days ago he developed sinus congestion, ear pressure, PND, ST, and a dry cough. No fever. He is drinking water and taking Ibuprofen. Also his BP had started to come down 6 months ago because he was able to lose a little weight. He has been cutting his BP pills in half and taking 1/2 a pill a day for several months. His BP has been stable in the 130's over 80's. Now his pharmacy recently changed manufacturers, and the BP pill are difficult to cut in half.    ROS: See pertinent positives and negatives per HPI.  Past Medical History:  Diagnosis Date   Chickenpox    GERD (gastroesophageal reflux disease)    with certain foods   Glaucoma 2019   narrow angle glaucoma-bilateral   Herniated disc    Hyperlipemia    on meds   Hypertension    on meds   Irritable bowel syndrome (IBS)    sees Dr. Laurence Spates   Obesity    Premature atrial contractions 02/06/2014   documented on event monitor for palpitations -dx by Thompson Grayer, cardiology   Seasonal allergies     Past Surgical History:  Procedure Laterality Date   GLAUCOMA SURGERY Bilateral 2019   UPPER GASTROINTESTINAL ENDOSCOPY  2014   Dr. Laurence Spates    Family History  Problem Relation Age of Onset   Colon polyps Mother 39   Arrhythmia Mother        AFIB   Diabetes Mother    Heart  attack Father        multiple MIs   Aneurysm Father    AAA (abdominal aortic aneurysm) Father    Other Father        Smoker's lung   Other Sister        Vertigo   Heart attack Maternal Grandfather 73   Sudden death Cousin 35   Depression Other        family hx   Hyperlipidemia Other        family hx   Hypertension Other        family hx   Colon cancer Neg Hx    Esophageal cancer Neg Hx    Stomach cancer Neg Hx    Rectal cancer Neg Hx      Current Outpatient Medications:    escitalopram (LEXAPRO) 10 MG tablet, TAKE 1 TABLET(10 MG) BY MOUTH DAILY, Disp: 90 tablet, Rfl: 0   famotidine (PEPCID) 20 MG tablet, Take 1 tablet (20 mg total) by mouth daily as needed for heartburn or indigestion., Disp: 1 tablet, Rfl: 0   losartan-hydrochlorothiazide (HYZAAR) 100-25 MG tablet, Take 1 tablet by mouth daily., Disp: 90 tablet, Rfl: 3  EXAM:  VITALS per patient if applicable:  GENERAL: alert, oriented, appears well and in no acute distress  HEENT: atraumatic, conjunttiva clear, no obvious abnormalities on  inspection of external nose and ears  NECK: normal movements of the head and neck  LUNGS: on inspection no signs of respiratory distress, breathing rate appears normal, no obvious gross SOB, gasping or wheezing  CV: no obvious cyanosis  MS: moves all visible extremities without noticeable abnormality  PSYCH/NEURO: pleasant and cooperative, no obvious depression or anxiety, speech and thought processing grossly intact  ASSESSMENT AND PLAN: His HTN is well controlled, so we will change the Losartan HCT to 50-12.5 tablets. He also has a sinus infection, and we will treat this with a Zpack.  Alysia Penna, MD  Discussed the following assessment and plan:  No diagnosis found.     I discussed the assessment and treatment plan with the patient. The patient was provided an opportunity to ask questions and all were answered. The patient agreed with the plan and demonstrated an  understanding of the instructions.   The patient was advised to call back or seek an in-person evaluation if the symptoms worsen or if the condition fails to improve as anticipated.      Review of Systems     Objective:   Physical Exam        Assessment & Plan:

## 2023-03-24 ENCOUNTER — Other Ambulatory Visit: Payer: Self-pay | Admitting: Family Medicine

## 2023-06-22 ENCOUNTER — Other Ambulatory Visit: Payer: Self-pay | Admitting: Family Medicine

## 2023-07-29 DIAGNOSIS — M9901 Segmental and somatic dysfunction of cervical region: Secondary | ICD-10-CM | POA: Diagnosis not present

## 2023-07-29 DIAGNOSIS — M25512 Pain in left shoulder: Secondary | ICD-10-CM | POA: Diagnosis not present

## 2023-07-31 DIAGNOSIS — M25512 Pain in left shoulder: Secondary | ICD-10-CM | POA: Diagnosis not present

## 2023-07-31 DIAGNOSIS — M9901 Segmental and somatic dysfunction of cervical region: Secondary | ICD-10-CM | POA: Diagnosis not present

## 2023-09-20 ENCOUNTER — Other Ambulatory Visit: Payer: Self-pay | Admitting: Family Medicine

## 2023-12-06 DIAGNOSIS — J069 Acute upper respiratory infection, unspecified: Secondary | ICD-10-CM | POA: Diagnosis not present

## 2023-12-28 ENCOUNTER — Other Ambulatory Visit: Payer: Self-pay | Admitting: Family Medicine

## 2024-01-07 ENCOUNTER — Ambulatory Visit: Payer: BC Managed Care – PPO | Admitting: Family Medicine

## 2024-02-06 ENCOUNTER — Telehealth: Payer: BC Managed Care – PPO | Admitting: Family Medicine

## 2024-02-06 DIAGNOSIS — I10 Essential (primary) hypertension: Secondary | ICD-10-CM | POA: Diagnosis not present

## 2024-02-06 MED ORDER — LOSARTAN POTASSIUM 25 MG PO TABS: 25.0000 mg | ORAL_TABLET | Freq: Every day | ORAL | 0 refills | Status: DC

## 2024-02-06 NOTE — Progress Notes (Signed)
 Subjective:    Patient ID: Michael Parker, male    DOB: Jun 16, 1966, 58 y.o.   MRN: [id number]  HPI Virtual Visit via Video Note  I connected with the patient on 02/06/24 at 10:30 AM EST by a video enabled telemedicine application and verified that I am speaking with the correct person using two identifiers.  Location patient: home Location provider:work or home office Persons participating in the virtual visit: patient, provider  I discussed the limitations of evaluation and management by telemedicine and the availability of in person appointments. The patient expressed understanding and agreed to proceed.   HPI: Here to follow up on HTN. He feels fine. He has been cutting his Losartan HCT pill in half and is taking 1/2 tablet a day. His BP averages in the 130's over 80's.    ROS: See pertinent positives and negatives per HPI.  Past Medical History:  Diagnosis Date   Chickenpox    GERD (gastroesophageal reflux disease)    with certain foods   Glaucoma 2019   narrow angle glaucoma-bilateral   Herniated disc    Hyperlipemia    on meds   Hypertension    on meds   Irritable bowel syndrome (IBS)    sees Dr. Carman Ching   Obesity    Premature atrial contractions 02/06/2014   documented on event monitor for palpitations -dx by Hillis Range, cardiology   Seasonal allergies     Past Surgical History:  Procedure Laterality Date   GLAUCOMA SURGERY Bilateral 2019   UPPER GASTROINTESTINAL ENDOSCOPY  2014   Dr. Carman Ching    Family History  Problem Relation Age of Onset   Colon polyps Mother 33   Arrhythmia Mother        AFIB   Diabetes Mother    Heart attack Father        multiple MIs   Aneurysm Father    AAA (abdominal aortic aneurysm) Father    Other Father        Smoker's lung   Other Sister        Vertigo   Heart attack Maternal Grandfather 39   Sudden death Cousin 75   Depression Other        family hx   Hyperlipidemia Other        family hx    Hypertension Other        family hx   Colon cancer Neg Hx    Esophageal cancer Neg Hx    Stomach cancer Neg Hx    Rectal cancer Neg Hx      Current Outpatient Medications:    escitalopram (LEXAPRO) 10 MG tablet, TAKE 1 TABLET(10 MG) BY MOUTH DAILY, Disp: 90 tablet, Rfl: 0   famotidine (PEPCID) 20 MG tablet, Take 1 tablet (20 mg total) by mouth daily as needed for heartburn or indigestion., Disp: 1 tablet, Rfl: 0   losartan (COZAAR) 25 MG tablet, Take 1 tablet (25 mg total) by mouth daily., Disp: 90 tablet, Rfl: 0  EXAM:  VITALS per patient if applicable:  GENERAL: alert, oriented, appears well and in no acute distress  HEENT: atraumatic, conjunttiva clear, no obvious abnormalities on inspection of external nose and ears  NECK: normal movements of the head and neck  LUNGS: on inspection no signs of respiratory distress, breathing rate appears normal, no obvious gross SOB, gasping or wheezing  CV: no obvious cyanosis  MS: moves all visible extremities without noticeable abnormality  PSYCH/NEURO: pleasant and cooperative, no obvious depression or  anxiety, speech and thought processing grossly intact  ASSESSMENT AND PLAN: HTN, stable. We will change from Losartan HCT to Losartan 25 mg daily. He will set up a well exam with labs sometime soon. Gershon Crane, MD  Discussed the following assessment and plan:  No diagnosis found.     I discussed the assessment and treatment plan with the patient. The patient was provided an opportunity to ask questions and all were answered. The patient agreed with the plan and demonstrated an understanding of the instructions.   The patient was advised to call back or seek an in-person evaluation if the symptoms worsen or if the condition fails to improve as anticipated.      Review of Systems     Objective:   Physical Exam        Assessment & Plan:

## 2024-03-12 ENCOUNTER — Encounter: Payer: Self-pay | Admitting: Family Medicine

## 2024-03-12 ENCOUNTER — Ambulatory Visit: Payer: BC Managed Care – PPO | Admitting: Family Medicine

## 2024-03-12 VITALS — BP 132/90 | HR 87 | Temp 97.7°F | Ht 72.0 in | Wt 242.0 lb

## 2024-03-12 DIAGNOSIS — Z23 Encounter for immunization: Secondary | ICD-10-CM | POA: Diagnosis not present

## 2024-03-12 DIAGNOSIS — Z0000 Encounter for general adult medical examination without abnormal findings: Secondary | ICD-10-CM

## 2024-03-12 LAB — HEPATIC FUNCTION PANEL
ALT: 27 U/L (ref 0–53)
AST: 16 U/L (ref 0–37)
Albumin: 4.4 g/dL (ref 3.5–5.2)
Alkaline Phosphatase: 72 U/L (ref 39–117)
Bilirubin, Direct: 0.1 mg/dL (ref 0.0–0.3)
Total Bilirubin: 0.7 mg/dL (ref 0.2–1.2)
Total Protein: 7.4 g/dL (ref 6.0–8.3)

## 2024-03-12 LAB — LIPID PANEL
Cholesterol: 135 mg/dL (ref 0–200)
HDL: 43.4 mg/dL (ref >39.00)
LDL Cholesterol: 81 mg/dL (ref 0–99)
NonHDL: 91.51
Total CHOL/HDL Ratio: 3
Triglycerides: 54 mg/dL (ref 0.0–149.0)
VLDL: 10.8 mg/dL (ref 0.0–40.0)

## 2024-03-12 LAB — HEMOGLOBIN A1C: Hgb A1c MFr Bld: 6.1 % (ref 4.6–6.5)

## 2024-03-12 LAB — CBC WITH DIFFERENTIAL/PLATELET
Basophils Absolute: 0.1 10*3/uL (ref 0.0–0.1)
Basophils Relative: 0.8 % (ref 0.0–3.0)
Eosinophils Absolute: 0.2 10*3/uL (ref 0.0–0.7)
Eosinophils Relative: 1.8 % (ref 0.0–5.0)
HCT: 47.9 % (ref 39.0–52.0)
Hemoglobin: 15.9 g/dL (ref 13.0–17.0)
Lymphocytes Relative: 17.1 % (ref 12.0–46.0)
Lymphs Abs: 1.4 10*3/uL (ref 0.7–4.0)
MCHC: 33.1 g/dL (ref 30.0–36.0)
MCV: 90.4 fl (ref 78.0–100.0)
Monocytes Absolute: 0.7 10*3/uL (ref 0.1–1.0)
Monocytes Relative: 8.2 % (ref 3.0–12.0)
Neutro Abs: 5.9 10*3/uL (ref 1.4–7.7)
Neutrophils Relative %: 72.1 % (ref 43.0–77.0)
Platelets: 285 10*3/uL (ref 150.0–400.0)
RBC: 5.3 Mil/uL (ref 4.22–5.81)
RDW: 14.1 % (ref 11.5–15.5)
WBC: 8.2 10*3/uL (ref 4.0–10.5)

## 2024-03-12 LAB — BASIC METABOLIC PANEL WITH GFR
BUN: 18 mg/dL (ref 6–23)
CO2: 30 meq/L (ref 19–32)
Calcium: 9.1 mg/dL (ref 8.4–10.5)
Chloride: 100 meq/L (ref 96–112)
Creatinine, Ser: 1.1 mg/dL (ref 0.40–1.50)
GFR: 75.69 mL/min (ref >60.00)
Glucose, Bld: 111 mg/dL — ABNORMAL HIGH (ref 70–99)
Potassium: 4.8 meq/L (ref 3.5–5.1)
Sodium: 139 meq/L (ref 135–145)

## 2024-03-12 LAB — PSA: PSA: 0.35 ng/mL (ref 0.10–4.00)

## 2024-03-12 LAB — TSH: TSH: 2.95 u[IU]/mL (ref 0.35–5.50)

## 2024-03-12 MED ORDER — ESCITALOPRAM OXALATE 10 MG PO TABS: ORAL_TABLET | ORAL | 3 refills | Status: AC

## 2024-03-12 NOTE — Progress Notes (Addendum)
 Subjective:    Patient ID: Michael Parker, male    DOB: Feb 09, 1966, 58 y.o.   MRN: [id number]  HPI Here for a well exam. He feels well. His BP at home averages 120's over 70's.    Review of Systems  Constitutional: Negative.   HENT: Negative.    Eyes: Negative.   Respiratory: Negative.    Cardiovascular: Negative.   Gastrointestinal: Negative.   Genitourinary: Negative.   Musculoskeletal: Negative.   Skin: Negative.   Neurological: Negative.   Psychiatric/Behavioral: Negative.         Objective:   Physical Exam Constitutional:      General: He is not in acute distress.    Appearance: Normal appearance. He is well-developed. He is not diaphoretic.  HENT:     Head: Normocephalic and atraumatic.     Right Ear: External ear normal.     Left Ear: External ear normal.     Nose: Nose normal.     Mouth/Throat:     Pharynx: No oropharyngeal exudate.  Eyes:     General: No scleral icterus.       Right eye: No discharge.        Left eye: No discharge.     Conjunctiva/sclera: Conjunctivae normal.     Pupils: Pupils are equal, round, and reactive to light.  Neck:     Thyroid: No thyromegaly.     Vascular: No JVD.     Trachea: No tracheal deviation.  Cardiovascular:     Rate and Rhythm: Normal rate and regular rhythm.     Pulses: Normal pulses.     Heart sounds: Normal heart sounds. No murmur heard.    No friction rub. No gallop.  Pulmonary:     Effort: Pulmonary effort is normal. No respiratory distress.     Breath sounds: Normal breath sounds. No wheezing or rales.  Chest:     Chest wall: No tenderness.  Abdominal:     General: Bowel sounds are normal. There is no distension.     Palpations: Abdomen is soft. There is no mass.     Tenderness: There is no abdominal tenderness. There is no guarding or rebound.  Genitourinary:    Penis: Normal. No tenderness.      Testes: Normal.     Prostate: Normal.     Rectum: Normal. Guaiac result negative.  Musculoskeletal:         General: No tenderness. Normal range of motion.     Cervical back: Neck supple.  Lymphadenopathy:     Cervical: No cervical adenopathy.  Skin:    General: Skin is warm and dry.     Coloration: Skin is not pale.     Findings: No erythema or rash.  Neurological:     General: No focal deficit present.     Mental Status: He is alert and oriented to person, place, and time.     Cranial Nerves: No cranial nerve deficit.     Motor: No abnormal muscle tone.     Coordination: Coordination normal.     Deep Tendon Reflexes: Reflexes are normal and symmetric. Reflexes normal.  Psychiatric:        Mood and Affect: Mood normal.        Behavior: Behavior normal.        Thought Content: Thought content normal.        Judgment: Judgment normal.           Assessment & Plan:  Well exam. We discussed diet  and exercise. Get fasting labs. He has a colonoscopy coming up in October.  Gershon Crane, MD

## 2024-03-12 NOTE — Addendum Note (Signed)
 Addended by: Carola Rhine on: 03/12/2024 09:52 AM   Modules accepted: Orders

## 2024-03-17 ENCOUNTER — Encounter: Payer: Self-pay | Admitting: *Deleted

## 2024-05-06 ENCOUNTER — Other Ambulatory Visit: Payer: Self-pay | Admitting: Family Medicine

## 2024-08-07 ENCOUNTER — Other Ambulatory Visit: Payer: Self-pay | Admitting: Family Medicine

## 2024-08-25 ENCOUNTER — Encounter: Payer: Self-pay | Admitting: Family Medicine

## 2024-08-25 ENCOUNTER — Ambulatory Visit: Admitting: Family Medicine

## 2024-08-25 VITALS — BP 148/84 | HR 53 | Temp 98.0°F | Ht 72.0 in | Wt 244.0 lb

## 2024-08-25 DIAGNOSIS — H6123 Impacted cerumen, bilateral: Secondary | ICD-10-CM | POA: Diagnosis not present

## 2024-08-25 DIAGNOSIS — J3089 Other allergic rhinitis: Secondary | ICD-10-CM | POA: Diagnosis not present

## 2024-08-25 MED ORDER — CETIRIZINE HCL 10 MG PO TABS: 10.0000 mg | ORAL_TABLET | Freq: Every day | ORAL | 11 refills | Status: AC

## 2024-08-25 MED ORDER — FLUTICASONE PROPIONATE 50 MCG/ACT NA SUSP: 2.0000 | Freq: Every day | NASAL | 6 refills | Status: AC

## 2024-08-25 NOTE — Progress Notes (Signed)
 Established Patient Office Visit   Subjective:  Patient ID: Michael Parker, male    DOB: [birth year]  Age: 58 y.o. MRN: [id number]  Chief Complaint  Patient presents with   Ear Pain   Ear Fullness    Ear Fullness    Patient is here for an acute visit with complaints of ear symptoms. Dr. Garnette Olmsted is his primary care provider.   Patient is complaining of left ear pressure, muffled hearing and right ear has intermittent pain when applying pressure to the external ear. Started today. He reports he has had some clear drainage in the left ear.   Denies fever.   Previously took OTC nasal spray and allergy medication during these season changes.  ROS See HPI above     Objective:   BP (!) 148/84   Pulse (!) 53   Temp 98 F (36.7 C) (Oral)   Ht 6' (1.829 m)   Wt 244 lb (110.7 kg)   SpO2 98%   BMI 33.09 kg/m    Physical Exam Vitals reviewed.  Constitutional:      General: He is not in acute distress.    Appearance: Normal appearance. He is obese. He is not ill-appearing, toxic-appearing or diaphoretic.  HENT:     Head: Normocephalic and atraumatic.     Right Ear: There is impacted cerumen.     Left Ear: There is impacted cerumen.  Eyes:     General:        Right eye: No discharge.        Left eye: No discharge.     Conjunctiva/sclera: Conjunctivae normal.  Cardiovascular:     Rate and Rhythm: Normal rate and regular rhythm.  Pulmonary:     Effort: Pulmonary effort is normal. No respiratory distress.  Musculoskeletal:        General: Normal range of motion.  Skin:    General: Skin is warm and dry.  Neurological:     General: No focal deficit present.     Mental Status: He is alert and oriented to person, place, and time. Mental status is at baseline.     Motor: No weakness.     Gait: Gait normal.  Psychiatric:        Mood and Affect: Mood normal.        Behavior: Behavior normal.        Thought Content: Thought content normal.        Judgment: Judgment  normal.   PRE-PROCEDURE EXAM: Left and Right TM cannot be visualized due to total occlusion/impaction of the ear canal. PROCEDURE INDICATION: Remove wax to visualize ear drum & relieve discomfort CONSENT:  Verbalto  PROCEDURE NOTE: Left and Right ear: The CMA, Kyleigh Sigmon, irrigated both ears with warm water and ear drops to remove the wax. 100% of the wax was removed.  POST- PROCEDURE EXAM: TMs successfully visualized. TM with no erythema. The patient tolerated the procedure well.     Assessment & Plan:  Hearing loss of both ears due to cerumen impaction -     Ear Lavage  Environmental and seasonal allergies -     Fluticasone  Propionate; Place 2 sprays into both nostrils daily.  Dispense: 16 g; Refill: 6 -     Cetirizine  HCl; Take 1 tablet (10 mg total) by mouth daily.  Dispense: 30 tablet; Refill: 11  -Ear lavage successfully completed on both ears due to ear wax impaction. -May use over the counter Debrox every 3-6 months to cleanse ear canals  at home or when in the office, have provider assess ears.  -Prescribed Flonase  and Zyrtec  for seasonal and environmental allergies.   Nylia Gavina, NP

## 2024-08-25 NOTE — Patient Instructions (Signed)
-  It was a pleasure to care for you today. -Ear lavage successfully completed on both ears due to ear wax impaction. -May use over the counter Debrox every 3-6 months to cleanse ear canals at home or when in the office, have provider assess ears.  -Prescribed Flonase  and Zyrtec  for seasonal and environmental allergies.

## 2024-11-01 DIAGNOSIS — H6122 Impacted cerumen, left ear: Secondary | ICD-10-CM | POA: Diagnosis not present

## 2024-11-01 DIAGNOSIS — H60392 Other infective otitis externa, left ear: Secondary | ICD-10-CM | POA: Diagnosis not present

## 2024-11-10 ENCOUNTER — Other Ambulatory Visit: Payer: Self-pay | Admitting: Family Medicine

## 2024-11-11 ENCOUNTER — Other Ambulatory Visit: Payer: Self-pay

## 2024-11-11 ENCOUNTER — Emergency Department (HOSPITAL_BASED_OUTPATIENT_CLINIC_OR_DEPARTMENT_OTHER)
Admission: EM | Admit: 2024-11-11 | Discharge: 2024-11-11 | Disposition: A | Discharge status: Home / Self Care | Attending: Emergency Medicine | Admitting: Emergency Medicine

## 2024-11-11 DIAGNOSIS — S0181XA Laceration without foreign body of other part of head, initial encounter: Secondary | ICD-10-CM | POA: Insufficient documentation

## 2024-11-11 DIAGNOSIS — W19XXXA Unspecified fall, initial encounter: Secondary | ICD-10-CM

## 2024-11-11 DIAGNOSIS — S01111A Laceration without foreign body of right eyelid and periocular area, initial encounter: Secondary | ICD-10-CM | POA: Diagnosis not present

## 2024-11-11 DIAGNOSIS — I10 Essential (primary) hypertension: Secondary | ICD-10-CM | POA: Diagnosis not present

## 2024-11-11 DIAGNOSIS — Z79899 Other long term (current) drug therapy: Secondary | ICD-10-CM | POA: Diagnosis not present

## 2024-11-11 DIAGNOSIS — W01198A Fall on same level from slipping, tripping and stumbling with subsequent striking against other object, initial encounter: Secondary | ICD-10-CM | POA: Diagnosis not present

## 2024-11-11 DIAGNOSIS — S0993XA Unspecified injury of face, initial encounter: Secondary | ICD-10-CM | POA: Diagnosis not present

## 2024-11-11 MED ORDER — LIDOCAINE-EPINEPHRINE (PF) 2 %-1:200000 IJ SOLN
20.0000 mL | Freq: Once | INTRAMUSCULAR | Status: AC
  Administered 2024-11-11: 20 mL
  Filled 2024-11-11: qty 20

## 2024-11-11 NOTE — ED Notes (Signed)
Wound cleaned and wrapped

## 2024-11-11 NOTE — ED Provider Notes (Signed)
 [place] [facility] AT [facility] Provider Note   CSN: [id number] Arrival date & time: 11/11/24  1057     Patient presents with: Michael   [name] is a 58 y.o. male.   58 year old male presenting with facial laceration.  Patient was loading furniture and was using a slick ramp when he fell forward and hit his face against some wood resulting in a laceration.  He did not have any loss of consciousness after the fall, no confusion or dizziness, no double vision or other vision changes.  He is not on blood thinners.  Wound is still actively bleeding and he is holding pressure.  No other injuries to report.   Fall       Prior to Admission medications   Medication Sig Start Date End Date Taking? Authorizing Provider  cetirizine  (ZYRTEC ) 10 MG tablet Take 1 tablet (10 mg total) by mouth daily. 08/25/24   Billy Philippe SAUNDERS, NP  escitalopram  (LEXAPRO ) 10 MG tablet TAKE 1 TABLET(10 MG) BY MOUTH DAILY 03/12/24   Johnny Garnette LABOR, MD  famotidine  (PEPCID ) 20 MG tablet Take 1 tablet (20 mg total) by mouth daily as needed for heartburn or indigestion. 01/22/19   Johnny Garnette LABOR, MD  fluticasone  (FLONASE ) 50 MCG/ACT nasal spray Place 2 sprays into both nostrils daily. 08/25/24   Billy Philippe SAUNDERS, NP  losartan  (COZAAR ) 25 MG tablet TAKE 1 TABLET(25 MG) BY MOUTH DAILY 08/10/24   Johnny Garnette LABOR, MD    Allergies: Cortisone, Ace inhibitors, Codeine, and Norvasc  [amlodipine  besylate]    Review of Systems  Updated Vital Signs  Vitals:   11/11/24 1110 11/11/24 1115  BP:  (!) 146/111  Pulse: (!) 102 67  Resp: 18   Temp: 97.9 F (36.6 C)   TempSrc: Oral   SpO2: 99% 99%     Physical Exam Vitals and nursing note reviewed.  HENT:     Head: Normocephalic.  Eyes:     Extraocular Movements: Extraocular movements intact.     Pupils: Pupils are equal, round, and reactive to light.     Comments: No pain with EOMs Supraorbital swelling of the right eye adjacent to the  laceration, see photo  Cardiovascular:     Rate and Rhythm: Normal rate.  Pulmonary:     Effort: Pulmonary effort is normal.  Musculoskeletal:     Cervical back: Normal range of motion.     Comments: Moves all extremities spontaneously without difficulty  Skin:    General: Skin is warm and dry.         Comments: Laceration to forehead between brows extending ~4-5cm, actively bleeding, see photo.  Neurological:     Mental Status: He is alert and oriented to person, place, and time.     (all labs ordered are listed, but only abnormal results are displayed) Labs Reviewed - No data to display  EKG: None  Radiology: No results found.   .Laceration Repair  Date/Time: 11/11/2024 1:52 PM  Performed by: Glendia Rocky SAILOR, PA-C Authorized by: Glendia Rocky SAILOR, PA-C   Consent:    Consent obtained:  Verbal   Consent given by:  Patient   Risks, benefits, and alternatives were discussed: yes     Risks discussed:  Need for additional repair, infection, pain, retained foreign body, tendon damage, vascular damage, poor cosmetic result, poor wound healing and nerve damage   Alternatives discussed:  No treatment Universal protocol:    Procedure explained and questions answered to patient or proxy's satisfaction:  yes     Patient identity confirmed:  Verbally with patient Anesthesia:    Anesthesia method:  Nerve block   Block needle gauge:  27 G   Block anesthetic:  Lidocaine  2% WITH epi   Block injection procedure:  Anatomic landmarks identified, introduced needle, incremental injection, negative aspiration for blood and anatomic landmarks palpated   Block outcome:  Anesthesia achieved Laceration details:    Location:  Face   Face location:  R eyebrow   Length (cm):  4 Exploration:    Hemostasis achieved with:  Direct pressure and epinephrine    Wound exploration: wound explored through full range of motion   Treatment:    Area cleansed with:  Saline   Amount of cleaning:  Extensive    Irrigation solution:  Sterile saline   Visualized foreign bodies/material removed: no     Layers/structures repaired:  Deep subcutaneous Deep subcutaneous:    Suture size:  5-0   Suture material:  Vicryl   Suture technique:  Simple interrupted   Number of sutures:  5 Skin repair:    Repair method:  Sutures   Suture size:  5-0   Suture material:  Prolene   Suture technique:  Simple interrupted   Number of sutures:  7 (6 simple interrupted, 1 horizontal mattress placed by Dr. Yolande) Approximation:    Approximation:  Close Repair type:    Repair type:  Intermediate Post-procedure details:    Procedure completion:  Tolerated Comments:     Supraorbital block performed by myself and Dr. Yolande    Medications Ordered in the ED  lidocaine -EPINEPHrine  (XYLOCAINE  W/EPI) 2 %-1:200000 (PF) injection 20 mL (20 mLs Infiltration Given by Other 11/11/24 1240)                                    Medical Decision Making This patient presents to the ED for concern of fall and laceration, this involves an extensive number of treatment options, and is a complaint that carries with it a high risk of complications and morbidity.  The differential diagnosis includes laceration, laceration with foreign body, laceration with tendon/ligament involvement, laceration with associated fracture, concussion   Co morbidities that complicate the patient evaluation  Hypertension, hyperlipidemia   Additional history obtained:  Additional history obtained from record review External records from outside source obtained and reviewed including prior PCP note   Cardiac Monitoring: / EKG:  The patient was maintained on a cardiac monitor.  I personally viewed and interpreted the cardiac monitored which showed an underlying rhythm of: NSR   Social Determinants of Health:  insufficient physical activity   Test / Admission - Considered:  Physical exam is notable as above, see photo.  Laceration was  thoroughly irrigated with normal saline, supraorbital block was performed by myself and Dr. Yolande, see above for additional detailed procedure note regarding laceration repair.  I discussed the utility of obtaining a CT scan in depth with the patient and his wife, patient did not have any loss of consciousness after striking his head and he has been in his normal state of health otherwise, he does not have any dizziness/confusion/double vision, at this time he does not feel that it is necessary to proceed with a CT scan of his head, and I feel that this is a reasonable plan.  I discussed the signs/symptoms of wound infection in depth with the patient and his wife, I recommend that he  follow-up with his PCP in the next 5 to 7 days for removal of the nonabsorbable sutures, he voiced understanding and is in agreement with this plan.  Return precautions discussed.  He is appropriate for discharge at this time.    Risk Prescription drug management.        Final diagnoses:  Fall, initial encounter    ED Discharge Orders     None          Glendia Rocky LOISE DEVONNA 11/11/24 1455    Yolande Lamar BROCKS, MD 11/12/24 2016

## 2024-11-11 NOTE — ED Notes (Signed)
 DC paperwork given and verbally understood.

## 2024-11-11 NOTE — ED Triage Notes (Signed)
 States fell while moving furniture. Lac to R eye. Denies LOC. Denies Thinners.

## 2024-11-11 NOTE — Discharge Instructions (Signed)
 Your laceration was repaired using sutures, the deeper portion of your wound was repaired using absorbable sutures however the more superficial aspect of your wound was repaired using nonabsorbable sutures, these will need to be removed in 5 to 7 days, this can be done at your PCP office, urgent care, or in the emergency department.  Monitor for signs/symptoms consistent with wound infection, including worsening redness/pain/swelling/drainage from the site of your wound.  Return to the emergency department if your symptoms worsen.  Follow-up with your PCP next week.

## 2024-11-18 ENCOUNTER — Encounter: Payer: Self-pay | Admitting: Family Medicine

## 2024-11-18 ENCOUNTER — Ambulatory Visit: Admitting: Family Medicine

## 2024-11-18 VITALS — BP 150/94 | HR 63 | Temp 98.2°F | Ht 73.5 in | Wt 252.0 lb

## 2024-11-18 DIAGNOSIS — S0181XD Laceration without foreign body of other part of head, subsequent encounter: Secondary | ICD-10-CM

## 2024-11-18 NOTE — Progress Notes (Signed)
° °  Subjective:    Patient ID: Michael Parker, male    DOB: 1966/10/12, 57 y.o.   MRN: [id number]  HPI Here to follow up on an ED visit on 11-11-24 for facial injuries after a fall. He had been loading furniture onto a truck when his foot slipped on the ramp. He fell forward, striking his face against a piece of wood. There was no LOC. He had a large laceration, so he was taken to the ED. While there his neurologic exam was normal. He had a jagged laceration above the right eyebrow. This was closed using a layered approach. He has been dressing this with Neosporin since then. He has mild discomfort around the eye, but his vision is normal. He denies any headache or dizziness or nausea.    Review of Systems  Constitutional: Negative.   Eyes:  Negative for visual disturbance.  Respiratory: Negative.    Cardiovascular: Negative.   Skin:  Positive for wound.  Neurological: Negative.        Objective:   Physical Exam Constitutional:      General: He is not in acute distress.    Appearance: Normal appearance.  Eyes:     Extraocular Movements: Extraocular movements intact.     Pupils: Pupils are equal, round, and reactive to light.     Comments: There is a horizontal 2 cm laceration just above the right eyebrow. This is closed with 8 sutures. No signs of infection.   Neurological:     Mental Status: He is alert.           Assessment & Plan:  Facial laceration. All sutures were removed. The area was cleaned. I advised him to apply Vaseline ointment to the area rather than Neosporin daily. Recheck as needed.I personally spent a total of 31 minutes in the care of the patient today including getting/reviewing separately obtained history and performing a medically appropriate exam/evaluation.  Garnette Olmsted, MD

## 2024-12-03 ENCOUNTER — Telehealth (INDEPENDENT_AMBULATORY_CARE_PROVIDER_SITE_OTHER): Payer: Self-pay

## 2024-12-03 DIAGNOSIS — I10 Essential (primary) hypertension: Secondary | ICD-10-CM

## 2024-12-03 NOTE — Progress Notes (Signed)
" ° °  12/03/2024  Patient ID: Michael Parker, male   DOB: December 21, 1965, 58 y.o.   MRN: [id number]  Pharmacy Quality Measure Review  This patient is appearing on a report for being at risk of failing the Controlling Blood Pressure measure this calendar year.   Last documented BP 150/94 on 11/18/24  Spoke with patient via telephone. Reports monitoring BP at home and it has improved since his injury earlier in month. Most recent reading was 132/76.  Jon VEAR Lindau, PharmD Clinical Pharmacist [phone]   "
